# Patient Record
Sex: Female | Born: 1990 | Race: White | Hispanic: No | Marital: Married | Smoking: Never smoker
Health system: Southern US, Community
[De-identification: ages and names within clinical notes are randomized; demographics above are authoritative.]

## PROBLEM LIST (undated history)

## (undated) HISTORY — DX: Hemochromatosis, unspecified: E83.119

## (undated) HISTORY — PX: OTHER SURGICAL HISTORY: SHX169

---

## 2020-12-04 ENCOUNTER — Ambulatory Visit (HOSPITAL_BASED_OUTPATIENT_CLINIC_OR_DEPARTMENT_OTHER)
Admission: RE | Admit: 2020-12-04 | Discharge: 2020-12-04 | Disposition: A | Discharge status: Home / Self Care | Payer: BLUE CROSS/BLUE SHIELD | Source: Ambulatory Visit | Attending: Emergency Medicine | Admitting: Emergency Medicine

## 2020-12-04 ENCOUNTER — Other Ambulatory Visit: Payer: Self-pay | Admitting: Emergency Medicine

## 2020-12-04 ENCOUNTER — Other Ambulatory Visit: Payer: Self-pay

## 2020-12-04 ENCOUNTER — Ambulatory Visit
Admission: EM | Admit: 2020-12-04 | Discharge: 2020-12-04 | Disposition: A | Discharge status: Home / Self Care | Payer: BLUE CROSS/BLUE SHIELD | Attending: Emergency Medicine | Admitting: Emergency Medicine

## 2020-12-04 DIAGNOSIS — M25552 Pain in left hip: Secondary | ICD-10-CM

## 2020-12-04 DIAGNOSIS — R000 Tachycardia, unspecified: Secondary | ICD-10-CM | POA: Diagnosis not present

## 2020-12-04 DIAGNOSIS — J4540 Moderate persistent asthma, uncomplicated: Secondary | ICD-10-CM

## 2020-12-04 LAB — POCT INFLUENZA A/B
Influenza A, POC: NEGATIVE
Influenza B, POC: NEGATIVE

## 2020-12-04 MED ORDER — METHYLPREDNISOLONE SODIUM SUCC 125 MG IJ SOLR
125.0000 mg | Freq: Once | INTRAMUSCULAR | Status: AC
  Administered 2020-12-04: 125 mg via INTRAMUSCULAR

## 2020-12-04 MED ORDER — METHYLPREDNISOLONE 4 MG PO TBPK: ORAL_TABLET | ORAL | 0 refills | Status: DC

## 2020-12-04 NOTE — Discharge Instructions (Addendum)
For the pain in your left hip, you received an injection of Solu-Medrol 125 mg in the office today.  I would like for you to follow this up with a tapering dose of methylprednisolone, please take 1 row of tablets daily for the next 6 days.  I recommend that you take 3 days off from work to allow the inflammation in your left hip joint to calm down.  Please go to the [facility] emergency room location for x-ray of her left hip.  We will be looking for signs of arthritis, bone spurs, joint displacement, possible soft tissue displacement.  Based on those results, you may need to see orthopedic, I provided you with the names of 2 clinics that accept patients on walk-in basis without appointments, please call before you go.  I have initiated a request on your behalf to help you find a primary care provider for follow-up of your asthma, hemochromatosis and hip pain.  Also performed a CBC in clinic to evaluate your number of red blood cells and you number of white blood cells as well as the breakdown of what types of white blood cells are elevated if elevated at all.  This would be good information for your new primary care provider.  With hemochromatosis, we know that some patients develop early arthritis and inflammation of the soft tissue and joints due to the elevated iron deposits.  If orthopedics does not feel that they can help you with this issue, you may also be referred to see a rheumatologist.  There are good immune suppressing drugs that can help with arthritic changes related to hemochromatosis.  Finally, you will be contacted with the results of your influenza test as soon as it is complete.  Thank you for visiting urgent care, I have refilled her same.

## 2020-12-04 NOTE — ED Triage Notes (Signed)
Pt report left hip pain, patient denies falling or doing anything that may have caused pain.  Started: 2 days ago

## 2020-12-04 NOTE — ED Provider Notes (Signed)
[facility]    CSN: [id number] Arrival date & time: 12/04/20  0919      History   Chief Complaint Chief Complaint  Patient presents with   Leg Pain    HPI Lori Benton is a 30 y.o. female.   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  Patient states that when she got home from work last night she was unable to bear weight on her left leg without pain, states the pain is in her hip and radiates from front to back and also down her leg.  Patient states that she is able to lie comfortably on her right side however she lays on her left side or her back she is unable to sleep secondary to intolerable pain.  Patient states she has not had this in the past.  Patient states she is not tried any medication for this.  Patient reports acute onset, denies trauma to hip, recent fall, reports a family history of arthritis in mom.  Per my observation, patient is petite in size, normal weight.  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  The history is provided by the patient.   History reviewed. No pertinent past medical history.  There are no problems to display for this patient.   History reviewed. No pertinent surgical history.  OB History   No obstetric history on file.      Home Medications    Prior to Admission medications   Medication Sig Start Date End Date Taking? Authorizing Provider  methylPREDNISolone (MEDROL DOSEPAK) 4 MG TBPK tablet Take 24 mg on day 1, 20 mg on day 2, 16 mg on day 3, 12 mg on day 4, 8 mg on day 5, 4 mg on day 6. 12/04/20  Yes Theadora Rama Scales, PA-C    Family History No family history on file.  Social History Social History   Tobacco Use   Smoking status: Never   Smokeless tobacco: Never  Vaping Use   Vaping Use: Never used  Substance Use Topics   Alcohol use: Never   Drug use: Never      Allergies   Sulfa antibiotics   Review of Systems Review of Systems Pertinent findings noted in history of present illness.    Physical Exam Triage Vital Signs ED Triage Vitals  Enc Vitals Group     BP 11/29/20 0827 (!) 147/82     Pulse Rate 11/29/20 0827 72     Resp 11/29/20 0827 18     Temp 11/29/20 0827 98.3 F (36.8 C)     Temp Source 11/29/20 0827 Oral     SpO2 11/29/20 0827 98 %     Weight --      Height --      Head Circumference --      Peak Flow --      Pain Score 11/29/20 0826 5     Pain Loc --      Pain Edu? --      Excl. in GC? --    No data found.  Updated Vital Signs BP 116/79 (BP Location: Left Arm)   Pulse (!) 105   Temp 99.5 F (37.5 C) (Oral)   Resp 18   LMP 12/02/2020   SpO2 98%   Visual Acuity Right Eye Distance:   Left Eye Distance:   Bilateral Distance:  Right Eye Near:   Left Eye Near:    Bilateral Near:     Physical Exam Vitals and nursing note reviewed.  Constitutional:      General: She is not in acute distress.    Appearance: Normal appearance. She is not ill-appearing.  HENT:     Head: Normocephalic and atraumatic.  Eyes:     General: Lids are normal.        Right eye: No discharge.        Left eye: No discharge.     Extraocular Movements: Extraocular movements intact.     Conjunctiva/sclera: Conjunctivae normal.     Right eye: Right conjunctiva is not injected.     Left eye: Left conjunctiva is not injected.  Neck:     Trachea: Trachea and phonation normal.  Cardiovascular:     Rate and Rhythm: Normal rate and regular rhythm.     Pulses: Normal pulses.     Heart sounds: Normal heart sounds. No murmur heard.   No friction rub. No gallop.  Pulmonary:     Effort: Pulmonary effort is normal. No accessory muscle usage, prolonged expiration or respiratory distress.     Breath sounds: Normal breath sounds. No stridor, decreased air movement or transmitted upper airway sounds. No decreased breath sounds, wheezing,  rhonchi or rales.  Chest:     Chest wall: No tenderness.  Musculoskeletal:        General: Normal range of motion.     Cervical back: Normal range of motion and neck supple. Normal range of motion.     Comments: Significant tenderness appreciated with deep palpation at left iliac crest, left SI joint, left greater trochanter, and the inguinal ligament  Lymphadenopathy:     Cervical: No cervical adenopathy.  Skin:    General: Skin is warm and dry.     Findings: No erythema or rash.  Neurological:     General: No focal deficit present.     Mental Status: She is alert and oriented to person, place, and time.  Psychiatric:        Mood and Affect: Mood normal.        Behavior: Behavior normal.     UC Treatments / Results  Labs (all labs ordered are listed, but only abnormal results are displayed) Labs Reviewed  CBC WITH DIFFERENTIAL/PLATELET  POCT INFLUENZA A/B    EKG   Radiology DG HIP UNILAT W OR W/O PELVIS 2-3 VIEWS LEFT  Result Date: 12/04/2020 CLINICAL DATA:  69 female with history of acute onset left hip pain. EXAM: DG HIP (WITH OR WITHOUT PELVIS) 2-3V LEFT COMPARISON:  None. FINDINGS: There is no evidence of hip fracture or dislocation. There is no evidence of arthropathy or other focal bone abnormality. IMPRESSION: No acute fracture, malalignment, or significant degenerative change. Electronically Signed   By: Marliss Coots M.D.   On: 12/04/2020 12:48    Procedures Procedures (including critical care time)  Medications Ordered in UC Medications  methylPREDNISolone sodium succinate (SOLU-MEDROL) 125 mg/2 mL injection 125 mg (125 mg Intramuscular Given 12/04/20 1134)    Initial Impression / Assessment and Plan / UC Course  I have reviewed the triage vital signs and the nursing notes.  Pertinent labs & imaging results that were available during my care of the patient were reviewed by me and considered in my medical decision making (see chart for details).      Patient advised that her symptoms of pain in her left hip may be related  to her history of hemochromatosis however could also be a repetitive use injury.  I have recommended that she have an x-ray performed of her hip and have also provided her with a 6-day course of steroids and a tapering dose to help relieve for her more immediate discomfort.  We also provided her with the names of 2 orthopedic clinics where she can obtain an appointment should her x-ray be worrisome for any arthritis or joint dysfunction.  I also advised patient that she may consider seeing a rheumatologist if orthopedics does not feel that they could be in of any use.  I have also initiated a request to help patient find a primary care physician to follow her hemochromatosis and asthma, possibly provide referrals for the above if needed.  Patient verbalized understanding and agreement of plan as discussed.  All questions were addressed during visit.  Please see discharge instructions below for further details of plan.  Final Clinical Impressions(s) / UC Diagnoses   Final diagnoses:  Left hip pain  Tachycardia  Hemochromatosis, unspecified hemochromatosis type  Moderate persistent asthma without complication     Discharge Instructions      For the pain in your left hip, you received an injection of Solu-Medrol 125 mg in the office today.  I would like for you to follow this up with a tapering dose of methylprednisolone, please take 1 row of tablets daily for the next 6 days.  I recommend that you take 3 days off from work to allow the inflammation in your left hip joint to calm down.  Please go to the [facility] emergency room location for x-ray of her left hip.  We will be looking for signs of arthritis, bone spurs, joint displacement, possible soft tissue displacement.  Based on those results, you may need to see orthopedic, I provided you with the names of 2 clinics that accept patients on walk-in basis without  appointments, please call before you go.  I have initiated a request on your behalf to help you find a primary care provider for follow-up of your asthma, hemochromatosis and hip pain.  Also performed a CBC in clinic to evaluate your number of red blood cells and you number of white blood cells as well as the breakdown of what types of white blood cells are elevated if elevated at all.  This would be good information for your new primary care provider.  With hemochromatosis, we know that some patients develop early arthritis and inflammation of the soft tissue and joints due to the elevated iron deposits.  If orthopedics does not feel that they can help you with this issue, you may also be referred to see a rheumatologist.  There are good immune suppressing drugs that can help with arthritic changes related to hemochromatosis.  Finally, you will be contacted with the results of your influenza test as soon as it is complete.  Thank you for visiting urgent care, I have refilled her same.     ED Prescriptions     Medication Sig Dispense Auth. Provider   methylPREDNISolone (MEDROL DOSEPAK) 4 MG TBPK tablet Take 24 mg on day 1, 20 mg on day 2, 16 mg on day 3, 12 mg on day 4, 8 mg on day 5, 4 mg on day 6. 21 tablet Theadora Rama Scales, PA-C      PDMP not reviewed this encounter.    Theadora Rama Scales, PA-C 12/04/20 1729

## 2020-12-05 LAB — CBC WITH DIFFERENTIAL/PLATELET
Basophils Absolute: 0.1 10*3/uL (ref 0.0–0.2)
Basos: 0 %
EOS (ABSOLUTE): 0 10*3/uL (ref 0.0–0.4)
Eos: 0 %
Hematocrit: 42.6 % (ref 34.0–46.6)
Hemoglobin: 14.2 g/dL (ref 11.1–15.9)
Immature Grans (Abs): 0 10*3/uL (ref 0.0–0.1)
Immature Granulocytes: 0 %
Lymphocytes Absolute: 1.4 10*3/uL (ref 0.7–3.1)
Lymphs: 9 %
MCH: 32.1 pg (ref 26.6–33.0)
MCHC: 33.3 g/dL (ref 31.5–35.7)
MCV: 96 fL (ref 79–97)
Monocytes Absolute: 1.2 10*3/uL — ABNORMAL HIGH (ref 0.1–0.9)
Monocytes: 8 %
Neutrophils Absolute: 12.5 10*3/uL — ABNORMAL HIGH (ref 1.4–7.0)
Neutrophils: 83 %
Platelets: 275 10*3/uL (ref 150–450)
RBC: 4.43 x10E6/uL (ref 3.77–5.28)
RDW: 12 % (ref 11.7–15.4)
WBC: 15.1 10*3/uL — ABNORMAL HIGH (ref 3.4–10.8)

## 2021-01-23 ENCOUNTER — Ambulatory Visit: Payer: Self-pay | Admitting: Nurse Practitioner

## 2021-01-24 ENCOUNTER — Encounter: Payer: Self-pay | Admitting: Medical

## 2021-01-24 ENCOUNTER — Ambulatory Visit (INDEPENDENT_AMBULATORY_CARE_PROVIDER_SITE_OTHER): Payer: BLUE CROSS/BLUE SHIELD | Admitting: Medical

## 2021-01-24 VITALS — BP 128/70 | HR 76 | Resp 18 | Ht 63.0 in | Wt 123.0 lb

## 2021-01-24 DIAGNOSIS — Z23 Encounter for immunization: Secondary | ICD-10-CM | POA: Diagnosis not present

## 2021-01-24 DIAGNOSIS — Z124 Encounter for screening for malignant neoplasm of cervix: Secondary | ICD-10-CM

## 2021-01-24 DIAGNOSIS — Z0000 Encounter for general adult medical examination without abnormal findings: Secondary | ICD-10-CM | POA: Diagnosis not present

## 2021-01-24 DIAGNOSIS — J4520 Mild intermittent asthma, uncomplicated: Secondary | ICD-10-CM

## 2021-01-24 LAB — CBC WITH DIFFERENTIAL/PLATELET
Basophils Absolute: 0 10*3/uL (ref 0.0–0.1)
Basophils Relative: 0.6 % (ref 0.0–3.0)
Eosinophils Absolute: 0.4 10*3/uL (ref 0.0–0.7)
Eosinophils Relative: 5.2 % — ABNORMAL HIGH (ref 0.0–5.0)
HCT: 40.3 % (ref 36.0–46.0)
Hemoglobin: 13.4 g/dL (ref 12.0–15.0)
Lymphocytes Relative: 37.6 % (ref 12.0–46.0)
Lymphs Abs: 2.8 10*3/uL (ref 0.7–4.0)
MCHC: 33.3 g/dL (ref 30.0–36.0)
MCV: 97.6 fl (ref 78.0–100.0)
Monocytes Absolute: 0.5 10*3/uL (ref 0.1–1.0)
Monocytes Relative: 6.1 % (ref 3.0–12.0)
Neutro Abs: 3.7 10*3/uL (ref 1.4–7.7)
Neutrophils Relative %: 50.5 % (ref 43.0–77.0)
Platelets: 276 10*3/uL (ref 150.0–400.0)
RBC: 4.13 Mil/uL (ref 3.87–5.11)
RDW: 13.3 % (ref 11.5–15.5)
WBC: 7.3 10*3/uL (ref 4.0–10.5)

## 2021-01-24 LAB — COMPREHENSIVE METABOLIC PANEL
ALT: 24 U/L (ref 0–35)
AST: 19 U/L (ref 0–37)
Albumin: 4.1 g/dL (ref 3.5–5.2)
Alkaline Phosphatase: 73 U/L (ref 39–117)
BUN: 14 mg/dL (ref 6–23)
CO2: 28 mEq/L (ref 19–32)
Calcium: 8.7 mg/dL (ref 8.4–10.5)
Chloride: 102 mEq/L (ref 96–112)
Creatinine, Ser: 0.95 mg/dL (ref 0.40–1.20)
GFR: 80.53 mL/min (ref >60.00)
Glucose, Bld: 79 mg/dL (ref 70–99)
Potassium: 3.7 mEq/L (ref 3.5–5.1)
Sodium: 138 mEq/L (ref 135–145)
Total Bilirubin: 0.4 mg/dL (ref 0.2–1.2)
Total Protein: 6.9 g/dL (ref 6.0–8.3)

## 2021-01-24 LAB — IRON: Iron: 108 ug/dL (ref 42–145)

## 2021-01-24 LAB — LIPID PANEL
Cholesterol: 221 mg/dL — ABNORMAL HIGH (ref 0–200)
HDL: 66.1 mg/dL (ref >39.00)
LDL Cholesterol: 141 mg/dL — ABNORMAL HIGH (ref 0–99)
NonHDL: 154.97
Total CHOL/HDL Ratio: 3
Triglycerides: 68 mg/dL (ref 0.0–149.0)
VLDL: 13.6 mg/dL (ref 0.0–40.0)

## 2021-01-24 MED ORDER — ALBUTEROL SULFATE HFA 108 (90 BASE) MCG/ACT IN AERS: 2.0000 | INHALATION_SPRAY | Freq: Four times a day (QID) | RESPIRATORY_TRACT | 0 refills | Status: DC | PRN

## 2021-01-24 NOTE — Progress Notes (Signed)
Subjective:    Patient ID: Lori Benton, female    DOB: October 22, 1990, 30 y.o.   MRN: [id number]  HPI  Pt in for first time.  Pt works post [employer], Pt does not exercise officially but walks 15,000 steps at work. Pt thinks eats moderate healthy. Drinks 2-3 sodas at night. Non smoker. Occasional alcohol use 1-2 times a months. When dose drink 7-8 shots. Has 3 children.  Pt states new to area for 1.5 years.   Pt moved from [place]. Orignally from [place].  States in November. some bursitis of her rt hip. Pt was given steroid injection and medrol dose taper pack.   Hx hemochromatosis. Pt states dx in 2010. She saw specialist briefly.   Last papsmear 2016. Normal.   Lmp- Jan 05, 2021.      Review of Systems  Constitutional:  Negative for chills, fatigue and fever.  HENT:  Negative for congestion, drooling and ear discharge.   Respiratory:  Negative for cough, chest tightness, shortness of breath and wheezing.   Cardiovascular:  Negative for chest pain and palpitations.  Gastrointestinal:  Negative for abdominal distention, abdominal pain, blood in stool, constipation, nausea and vomiting.  Genitourinary:  Negative for difficulty urinating, dysuria, flank pain, frequency and urgency.  Musculoskeletal:  Negative for back pain, myalgias and neck stiffness.  Skin:  Negative for rash.  Neurological:  Negative for dizziness, speech difficulty, weakness, numbness and headaches.  Hematological:  Negative for adenopathy. Does not bruise/bleed easily.  Psychiatric/Behavioral:  Negative for behavioral problems and decreased concentration.    No past medical history on file.   Social History   Socioeconomic History   Marital status: Married    Spouse name: Not on file   Number of children: Not on file   Years of education: Not on file   Highest education level: Not on file  Occupational History   Not on file  Tobacco Use   Smoking status: Never   Smokeless tobacco: Never   Vaping Use   Vaping Use: Never used  Substance and Sexual Activity   Alcohol use: Never   Drug use: Never   Sexual activity: Not on file  Other Topics Concern   Not on file  Social History Narrative   Not on file   Social Determinants of Health   Financial Resource Strain: Not on file  Food Insecurity: Not on file  Transportation Needs: Not on file  Physical Activity: Not on file  Stress: Not on file  Social Connections: Not on file  Intimate Partner Violence: Not on file    No past surgical history on file.  No family history on file.  Allergies  Allergen Reactions   Sulfa Antibiotics Anaphylaxis   Medrol [Methylprednisolone] Rash    No current outpatient medications on file prior to visit.   No current facility-administered medications on file prior to visit.    BP 128/70    Pulse 76    Resp 18    Ht 5\' 3"  (1.6 m)    Wt 123 lb (55.8 kg)    LMP 01/05/2021    SpO2 100%    BMI 21.79 kg/m        Objective:   Physical Exam  General Mental Status- Alert. General Appearance- Not in acute distress.   Skin General: Color- Normal Color. Moisture- Normal Moisture.  Neck Carotid Arteries- Normal color. Moisture- Normal Moisture. No carotid bruits. No JVD.  Chest and Lung Exam Auscultation: Breath Sounds:-Normal.  Cardiovascular Auscultation:Rythm- Regular. Murmurs &  Other Heart Sounds:Auscultation of the heart reveals- No Murmurs.  Abdomen Inspection:-Inspeection Normal. Palpation/Percussion:Note:No mass. Palpation and Percussion of the abdomen reveal- Non Tender, Non Distended + BS, no rebound or guarding.   Neurologic Cranial Nerve exam:- CN III-XII intact(No nystagmus), symmetric smile. Strength:- 5/5 equal and symmetric strength both upper and lower extremities.       Assessment & Plan:   Patient Instructions  For you wellness exam today I have ordered cbc, cmp and lipid panel.  Flu vaccine today  Recommend exercise and healthy diet.  We  will let you know lab results as they come in.  Follow up date appointment will be determined after lab review.    Refer to gyn for screening papsmear.  Add iron level and refer to hematologist for hemochromatosis.  Rare wheezing with hx of asthma. Last flare in 2018. Rx albuterol inhaler.    Esperanza Richters, PA-C

## 2021-01-24 NOTE — Patient Instructions (Addendum)
For you wellness exam today I have ordered cbc, cmp and lipid panel. ° °Flu vaccine today ° °Recommend exercise and healthy diet. ° °We will let you know lab results as they come in. ° °Follow up date appointment will be determined after lab review.   ° °Refer to gyn for screening papsmear. ° °Add iron level and refer to hematologist for hemochromatosis. ° °Rare wheezing with hx of asthma. Last flare in 2018. Rx albuterol inhaler. ° °Preventive Care 21-39 Years Old, Female °Preventive care refers to lifestyle choices and visits with your health care provider that can promote health and wellness. Preventive care visits are also called wellness exams. °What can I expect for my preventive care visit? °Counseling °During your preventive care visit, your health care provider may ask about your: °Medical history, including: °Past medical problems. °Family medical history. °Pregnancy history. °Current health, including: °Menstrual cycle. °Method of birth control. °Emotional well-being. °Home life and relationship well-being. °Sexual activity and sexual health. °Lifestyle, including: °Alcohol, nicotine or tobacco, and drug use. °Access to firearms. °Diet, exercise, and sleep habits. °Work and work environment. °Sunscreen use. °Safety issues such as seatbelt and bike helmet use. °Physical exam °Your health care provider may check your: °Height and weight. These may be used to calculate your BMI (body mass index). BMI is a measurement that tells if you are at a healthy weight. °Waist circumference. This measures the distance around your waistline. This measurement also tells if you are at a healthy weight and may help predict your risk of certain diseases, such as type 2 diabetes and high blood pressure. °Heart rate and blood pressure. °Body temperature. °Skin for abnormal spots. °What immunizations do I need? °Vaccines are usually given at various ages, according to a schedule. Your health care provider will recommend  vaccines for you based on your age, medical history, and lifestyle or other factors, such as travel or where you work. °What tests do I need? °Screening °Your health care provider may recommend screening tests for certain conditions. This may include: °Pelvic exam and Pap test. °Lipid and cholesterol levels. °Diabetes screening. This is done by checking your blood sugar (glucose) after you have not eaten for a while (fasting). °Hepatitis B test. °Hepatitis C test. °HIV (human immunodeficiency virus) test. °STI (sexually transmitted infection) testing, if you are at risk. °BRCA-related cancer screening. This may be done if you have a family history of breast, ovarian, tubal, or peritoneal cancers. °Talk with your health care provider about your test results, treatment options, and if necessary, the need for more tests. °Follow these instructions at home: °Eating and drinking ° °Eat a healthy diet that includes fresh fruits and vegetables, whole grains, lean protein, and low-fat dairy products. °Take vitamin and mineral supplements as recommended by your health care provider. °Do not drink alcohol if: °Your health care provider tells you not to drink. °You are pregnant, may be pregnant, or are planning to become pregnant. °If you drink alcohol: °Limit how much you have to 0-1 drink a day. °Know how much alcohol is in your drink. In the [place] one drink equals one 12 oz bottle of beer (355 mL), one 5 oz glass of wine (148 mL), or one 1½ oz glass of hard liquor (44 mL). °Lifestyle °Brush your teeth every morning and night with fluoride toothpaste. Floss one time each day. °Exercise for at least 30 minutes 5 or more days each week. °Do not use any products that contain nicotine or tobacco. These products include cigarettes,   chewing tobacco, and vaping devices, such as e-cigarettes. If you need help quitting, ask your health care provider. °Do not use drugs. °If you are sexually active, practice safe sex. Use a condom or  other form of protection to prevent STIs. °If you do not wish to become pregnant, use a form of birth control. If you plan to become pregnant, see your health care provider for a prepregnancy visit. °Find healthy ways to manage stress, such as: °Meditation, yoga, or listening to music. °Journaling. °Talking to a trusted person. °Spending time with friends and family. °Minimize exposure to UV radiation to reduce your risk of skin cancer. °Safety °Always wear your seat belt while driving or riding in a vehicle. °Do not drive: °If you have been drinking alcohol. Do not ride with someone who has been drinking. °If you have been using any mind-altering substances or drugs. °While texting. °When you are tired or distracted. °Wear a helmet and other protective equipment during sports activities. °If you have firearms in your house, make sure you follow all gun safety procedures. °Seek help if you have been physically or sexually abused. °What's next? °Go to your health care provider once a year for an annual wellness visit. °Ask your health care provider how often you should have your eyes and teeth checked. °Stay up to date on all vaccines. °This information is not intended to replace advice given to you by your health care provider. Make sure you discuss any questions you have with your health care provider. °Document Revised: 07/17/2020 Document Reviewed: 07/17/2020 °Elsevier Patient Education © 2022 [employer] ° ° °

## 2021-01-29 NOTE — Addendum Note (Signed)
Addended by: Gwenevere Abbot on: 01/29/2021 07:19 PM   Modules accepted: Level of Service

## 2021-01-31 ENCOUNTER — Other Ambulatory Visit: Payer: Self-pay | Admitting: Family

## 2021-02-04 ENCOUNTER — Inpatient Hospital Stay: Payer: BLUE CROSS/BLUE SHIELD | Attending: Hematology & Oncology

## 2021-02-04 ENCOUNTER — Inpatient Hospital Stay (HOSPITAL_BASED_OUTPATIENT_CLINIC_OR_DEPARTMENT_OTHER): Payer: BLUE CROSS/BLUE SHIELD | Admitting: Family

## 2021-02-04 ENCOUNTER — Encounter: Payer: Self-pay | Admitting: Family

## 2021-02-04 ENCOUNTER — Other Ambulatory Visit: Payer: Self-pay

## 2021-02-04 LAB — RETICULOCYTES
Immature Retic Fract: 7 % (ref 2.3–15.9)
RBC.: 4.03 MIL/uL (ref 3.87–5.11)
Retic Count, Absolute: 74.6 10*3/uL (ref 19.0–186.0)
Retic Ct Pct: 1.9 % (ref 0.4–3.1)

## 2021-02-04 LAB — CMP (CANCER CENTER ONLY)
ALT: 22 U/L (ref 0–44)
AST: 16 U/L (ref 15–41)
Albumin: 4.1 g/dL (ref 3.5–5.0)
Alkaline Phosphatase: 70 U/L (ref 38–126)
Anion gap: 6 (ref 5–15)
BUN: 10 mg/dL (ref 6–20)
CO2: 27 mmol/L (ref 22–32)
Calcium: 9.1 mg/dL (ref 8.9–10.3)
Chloride: 107 mmol/L (ref 98–111)
Creatinine: 0.96 mg/dL (ref 0.44–1.00)
GFR, Estimated: >60 mL/min (ref >60)
Glucose, Bld: 90 mg/dL (ref 70–99)
Potassium: 3.9 mmol/L (ref 3.5–5.1)
Sodium: 140 mmol/L (ref 135–145)
Total Bilirubin: 0.4 mg/dL (ref 0.3–1.2)
Total Protein: 6.8 g/dL (ref 6.5–8.1)

## 2021-02-04 LAB — CBC WITH DIFFERENTIAL (CANCER CENTER ONLY)
Abs Immature Granulocytes: 0.03 10*3/uL (ref 0.00–0.07)
Basophils Absolute: 0 10*3/uL (ref 0.0–0.1)
Basophils Relative: 1 %
Eosinophils Absolute: 0.4 10*3/uL (ref 0.0–0.5)
Eosinophils Relative: 8 %
HCT: 39.2 % (ref 36.0–46.0)
Hemoglobin: 13.1 g/dL (ref 12.0–15.0)
Immature Granulocytes: 1 %
Lymphocytes Relative: 40 %
Lymphs Abs: 2 10*3/uL (ref 0.7–4.0)
MCH: 32.9 pg (ref 26.0–34.0)
MCHC: 33.4 g/dL (ref 30.0–36.0)
MCV: 98.5 fL (ref 80.0–100.0)
Monocytes Absolute: 0.4 10*3/uL (ref 0.1–1.0)
Monocytes Relative: 7 %
Neutro Abs: 2.2 10*3/uL (ref 1.7–7.7)
Neutrophils Relative %: 43 %
Platelet Count: 274 10*3/uL (ref 150–400)
RBC: 3.98 MIL/uL (ref 3.87–5.11)
RDW: 12.4 % (ref 11.5–15.5)
WBC Count: 5 10*3/uL (ref 4.0–10.5)
nRBC: 0 % (ref 0.0–0.2)

## 2021-02-04 LAB — IRON AND IRON BINDING CAPACITY (CC-WL,HP ONLY)
Iron: 95 ug/dL (ref 28–170)
Saturation Ratios: 29 % (ref 10.4–31.8)
TIBC: 333 ug/dL (ref 250–450)
UIBC: 238 ug/dL (ref 148–442)

## 2021-02-04 LAB — LACTATE DEHYDROGENASE: LDH: 148 U/L (ref 98–192)

## 2021-02-04 LAB — FERRITIN: Ferritin: 25 ng/mL (ref 11–307)

## 2021-02-04 NOTE — Progress Notes (Addendum)
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 She states that she has not had insurance for 12 years and did not follow-up.  She had been donating blood as needed with the [employer].  She notes her main symptom to be joint aches and pains.  She has significant paternal history of NASH. No known familial history of hemochromatosis diagnosis.  Total iron in December with PCP was 108.  She states that her cycle is regular and flow is normal. No other blood loss noted.  No bruising or petechiae.  She has 3 children all born by C-section. She had gestational HTN/questionable preeclampsia.   With her last pregnancy she took 2 baby aspirin daily.  No history of miscarriage or still birth.  No history of diabetes or thyroid disease.  She has some occasional SOB secondary to asthma exacerbation.  No fever, chills, n/v, cough, rash, dizziness, chest pain, palpitations, abdominal pain or changes in bowel or bladder habits.  She has some abdominal cramping with her cycle.  No swelling or tenderness in her extremities at this time.  She has occasional tingling in her arms/hands felt to possibly be positions.  She does not smoke or use recreational drugs. She has the occasional alcoholic beverage socially.  She works for [employer] and enjoys her job.   ROS: All other 10 point review of systems is negative.   PAST MEDICAL HISTORY:   Past Medical History:  Diagnosis Date   Hemochromatosis     ALLERGIES: Allergies  Allergen Reactions   Sulfa Antibiotics Anaphylaxis   Medrol  [Methylprednisolone] Rash      MEDICATIONS:  Current Outpatient Medications on File Prior to Visit  Medication Sig Dispense Refill   albuterol (VENTOLIN HFA) 108 (90 Base) MCG/ACT inhaler Inhale 2 puffs into the lungs every 6 (six) hours as needed. 18 g 0   No current facility-administered medications on file prior to visit.     PAST SURGICAL HISTORY Past Surgical History:  Procedure Laterality Date   c section      FAMILY HISTORY: No family history on file.  SOCIAL HISTORY:  reports that she has never smoked. She has never used smokeless tobacco. She reports current alcohol use of about 7.0 standard drinks per week. She reports that she does not use drugs.  PERFORMANCE STATUS: The patient's performance status is 0 - Asymptomatic  PHYSICAL EXAM: Most Recent Vital Signs: Last menstrual period 01/05/2021. LMP 01/05/2021   General Appearance:    Alert, cooperative, no distress, appears stated age  Head:    Normocephalic, without obvious abnormality, atraumatic  Eyes:    PERRL, conjunctiva/corneas clear, EOM's intact, fundi    benign, both eyes        Throat:   Lips, mucosa, and tongue normal; teeth and gums normal  Neck:   Supple, symmetrical, trachea midline, no adenopathy;    thyroid:  no enlargement/tenderness/nodules; no carotid   bruit or JVD  Back:     Symmetric, no curvature, ROM normal, no CVA tenderness  Lungs:     Clear to auscultation bilaterally, respirations unlabored  Chest Wall:    No tenderness or deformity   Heart:    Regular rate and rhythm, S1 and S2 normal, no murmur, rub   or gallop     Abdomen:     Soft, non-tender, bowel sounds active all four quadrants,    no masses, no organomegaly        Extremities:   Extremities normal, atraumatic, no cyanosis or edema  Pulses:   2+ and symmetric all extremities  Skin:   Skin color, texture, turgor normal, no rashes or lesions  Lymph nodes:   Cervical, supraclavicular, and axillary nodes normal   Neurologic:   CNII-XII intact, normal strength, sensation and reflexes    throughout    LABORATORY DATA:  Results for orders placed or performed in visit on 02/04/21 (from the past 48 hour(s))  Reticulocytes     Status: None   Collection Time: 02/04/21  8:12 AM  Result Value Ref Range   Retic Ct Pct 1.9 0.4 - 3.1 %   RBC. 4.03 3.87 - 5.11 MIL/uL   Retic Count, Absolute 74.6 19.0 - 186.0 K/uL   Immature Retic Fract 7.0 2.3 - 15.9 %    Comment: Performed at [facility], [address] [place], [place] [address]  CBC with Differential (Cancer Center Only)     Status: None   Collection Time: 02/04/21  8:12 AM  Result Value Ref Range   WBC Count 5.0 4.0 - 10.5 K/uL   RBC 3.98 3.87 - 5.11 MIL/uL   Hemoglobin 13.1 12.0 - 15.0 g/dL   HCT 42.5 95.6 - 38.7 %   MCV 98.5 80.0 - 100.0 fL   MCH 32.9 26.0 - 34.0 pg   MCHC 33.4 30.0 - 36.0 g/dL   RDW 56.4 33.2 - 95.1 %   Platelet Count 274 150 - 400 K/uL   nRBC 0.0 0.0 - 0.2 %   Neutrophils Relative % 43 %   Neutro Abs 2.2 1.7 - 7.7 K/uL   Lymphocytes Relative 40 %   Lymphs Abs 2.0 0.7 - 4.0 K/uL   Monocytes Relative 7 %   Monocytes Absolute 0.4 0.1 - 1.0 K/uL   Eosinophils Relative 8 %   Eosinophils Absolute 0.4 0.0 - 0.5 K/uL   Basophils Relative 1 %   Basophils Absolute 0.0 0.0 - 0.1 K/uL   Immature Granulocytes 1 %   Abs Immature Granulocytes 0.03 0.00 - 0.07 K/uL    Comment: Performed at [facility], [address] [place], [place] [address]      RADIOGRAPHY: No results found.     PATHOLOGY: None  ASSESSMENT/PLAN: Lori Benton is a very pleasant 31 yo caucasian female with history of hemochromatosis diagnosed in 2010.  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  Iron studies pending. We will get her onto a donation schedule if  needed. Follow-up in 4 months.   All questions were answered. The patient knows to call the clinic with any problems, questions or concerns. We can certainly see the patient much sooner if necessary.  Eileen Stanford, NP

## 2021-02-05 ENCOUNTER — Encounter: Payer: Self-pay | Admitting: *Deleted

## 2021-02-07 LAB — HEMOCHROMATOSIS DNA-PCR(C282Y,H63D)

## 2021-04-02 ENCOUNTER — Encounter: Payer: BLUE CROSS/BLUE SHIELD | Admitting: Family Medicine

## 2021-05-20 ENCOUNTER — Telehealth: Payer: Self-pay | Admitting: *Deleted

## 2021-05-20 NOTE — Telephone Encounter (Signed)
Called patient and lvm of rescheduling appointment - requested callback to confirm 

## 2021-05-26 ENCOUNTER — Encounter: Payer: Self-pay | Admitting: Family

## 2021-05-26 ENCOUNTER — Inpatient Hospital Stay: Payer: BLUE CROSS/BLUE SHIELD | Attending: Hematology & Oncology

## 2021-05-26 ENCOUNTER — Inpatient Hospital Stay (HOSPITAL_BASED_OUTPATIENT_CLINIC_OR_DEPARTMENT_OTHER): Payer: BLUE CROSS/BLUE SHIELD | Admitting: Family

## 2021-05-26 LAB — CMP (CANCER CENTER ONLY)
ALT: 14 U/L (ref 0–44)
AST: 17 U/L (ref 15–41)
Albumin: 4.3 g/dL (ref 3.5–5.0)
Alkaline Phosphatase: 85 U/L (ref 38–126)
Anion gap: 9 (ref 5–15)
BUN: 10 mg/dL (ref 6–20)
CO2: 26 mmol/L (ref 22–32)
Calcium: 9.5 mg/dL (ref 8.9–10.3)
Chloride: 105 mmol/L (ref 98–111)
Creatinine: 0.87 mg/dL (ref 0.44–1.00)
GFR, Estimated: >60 mL/min (ref >60)
Glucose, Bld: 85 mg/dL (ref 70–99)
Potassium: 3.6 mmol/L (ref 3.5–5.1)
Sodium: 140 mmol/L (ref 135–145)
Total Bilirubin: 0.3 mg/dL (ref 0.3–1.2)
Total Protein: 7.8 g/dL (ref 6.5–8.1)

## 2021-05-26 LAB — CBC WITH DIFFERENTIAL (CANCER CENTER ONLY)
Abs Immature Granulocytes: 0.02 10*3/uL (ref 0.00–0.07)
Basophils Absolute: 0.1 10*3/uL (ref 0.0–0.1)
Basophils Relative: 1 %
Eosinophils Absolute: 0.3 10*3/uL (ref 0.0–0.5)
Eosinophils Relative: 4 %
HCT: 38.2 % (ref 36.0–46.0)
Hemoglobin: 12.5 g/dL (ref 12.0–15.0)
Immature Granulocytes: 0 %
Lymphocytes Relative: 26 %
Lymphs Abs: 2.4 10*3/uL (ref 0.7–4.0)
MCH: 31.4 pg (ref 26.0–34.0)
MCHC: 32.7 g/dL (ref 30.0–36.0)
MCV: 96 fL (ref 80.0–100.0)
Monocytes Absolute: 0.6 10*3/uL (ref 0.1–1.0)
Monocytes Relative: 6 %
Neutro Abs: 5.8 10*3/uL (ref 1.7–7.7)
Neutrophils Relative %: 63 %
Platelet Count: 324 10*3/uL (ref 150–400)
RBC: 3.98 MIL/uL (ref 3.87–5.11)
RDW: 12.8 % (ref 11.5–15.5)
WBC Count: 9.1 10*3/uL (ref 4.0–10.5)
nRBC: 0 % (ref 0.0–0.2)

## 2021-05-26 LAB — IRON AND IRON BINDING CAPACITY (CC-WL,HP ONLY)
Iron: 28 ug/dL (ref 28–170)
Saturation Ratios: 8 % — ABNORMAL LOW (ref 10.4–31.8)
TIBC: 371 ug/dL (ref 250–450)
UIBC: 343 ug/dL (ref 148–442)

## 2021-05-26 LAB — FERRITIN: Ferritin: 16 ng/mL (ref 11–307)

## 2021-05-26 NOTE — Progress Notes (Signed)
?Hematology and Oncology Follow Up Visit ? ?Lori Benton ?[id number] ?[birth year] 30 y.o. ?05/26/2021 ? ? 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 ?Current Therapy:   ?Phlebotomy to maintain iron saturation < 50% and ferritin < 100 ?Donates with [employer] when needed ?  ?Interim History:  Lori Benton is here today for follow-up. She is doing well and has no complaints at this time.  ?No headaches, blurred/ loss of vision, fever, chills, n/v, couhg, rash, dizziness, SOB, chest pain, palpitations, abdominal pain or changes in bowel or bladder habits.  ?No swelling or tenderness in her extremities. She has occasional numbness and tingling in her arms due to a childhood injury.  ?No falls or syncope.  ?She has maintained a good appetite and is staying well hydrated. Her weight is stable at 123 lbs.  ? ?ECOG Performance Status: 1 - Symptomatic but completely ambulatory ? ?Medications:  ?Allergies as of 05/26/2021   ? ?   Reactions  ? Celery (apium Graveolens Var. Dulce) Skin Test Hives, Shortness Of Breath, Itching, Cough  ? Sulfa Antibiotics Anaphylaxis  ? Medrol [methylprednisolone] Rash  ? ?  ? ?  ?Medication List  ?  ? ?  ? Accurate as of May 26, 2021  8:38 AM. If you have any questions, ask your nurse or doctor.  ?  ?  ? ?  ? ?albuterol 108 (90 Base) MCG/ACT inhaler ?Commonly known as: VENTOLIN HFA ?Inhale 2 puffs into the lungs every 6 (six) hours as needed. ?  ? ?  ? ? ?Allergies:  ?Allergies  ?Allergen Reactions  ? Celery (Apium Graveolens Var. Dulce) Skin Test Hives, Shortness Of Breath, Itching and Cough  ? Sulfa Antibiotics Anaphylaxis  ? Medrol [Methylprednisolone] Rash  ? ? ?Past Medical History, Surgical history, Social history, and Family History were reviewed and updated. ? ?Review of Systems: ?All other 10 point review of systems is negative.  ? ?Physical Exam: ? weight is 123 lb 12.8 oz (56.2 kg). Her oral temperature is 98.7 ?F (37.1 ?C). Her blood  pressure is 120/90 and her pulse is 75. Her respiration is 18 and oxygen saturation is 100%.  ? ?Wt Readings from Last 3 Encounters:  ?05/26/21 123 lb 12.8 oz (56.2 kg)  ?02/04/21 121 lb 1.3 oz (54.9 kg)  ?01/24/21 123 lb (55.8 kg)  ? ? ?Ocular: Sclerae unicteric, pupils equal, round and reactive to light ?Ear-nose-throat: Oropharynx clear, dentition fair ?Lymphatic: No cervical or supraclavicular adenopathy ?Lungs no rales or rhonchi, good excursion bilaterally ?Heart regular rate and rhythm, no murmur appreciated ?Abd soft, nontender, positive bowel sounds ?MSK no focal spinal tenderness, no joint edema ?Neuro: non-focal, well-oriented, appropriate affect ?Breasts: Deferred  ? ?Lab Results  ?Component Value Date  ? WBC 9.1 05/26/2021  ? HGB 12.5 05/26/2021  ? HCT 38.2 05/26/2021  ? MCV 96.0 05/26/2021  ? PLT 324 05/26/2021  ? ?Lab Results  ?Component Value Date  ? FERRITIN 25 02/04/2021  ? IRON 95 02/04/2021  ? TIBC 333 02/04/2021  ? UIBC 238 02/04/2021  ? IRONPCTSAT 29 02/04/2021  ? ?Lab Results  ?Component Value Date  ? RETICCTPCT 1.9 02/04/2021  ? RBC 3.98 05/26/2021  ? ?No results found for: KPAFRELGTCHN, LAMBDASER, KAPLAMBRATIO ?No results found for: IGGSERUM, IGA, IGMSERUM ?No results found for: TOTALPROTELP, ALBUMINELP, A1GS, A2GS, BETS, BETA2SER, GAMS, MSPIKE, SPEI ?  Chemistry   ?   ?Component Value Date/Time  ? NA 140 02/04/2021 0812  ? K 3.9 02/04/2021 0812  ?  CL 107 02/04/2021 0812  ? CO2 27 02/04/2021 0812  ? BUN 10 02/04/2021 0812  ? CREATININE 0.96 02/04/2021 0812  ?    ?Component Value Date/Time  ? CALCIUM 9.1 02/04/2021 0812  ? ALKPHOS 70 02/04/2021 0812  ? AST 16 02/04/2021 0812  ? ALT 22 02/04/2021 0812  ? BILITOT 0.4 02/04/2021 2956  ?  ? ? ? ?Impression and Plan: Lori Benton is a very pleasant 31 yo caucasian female with history of hemochromatosis diagnosed in 2010.  ?Iron studies are pending. She [facility].  ?Follow-up in 6 months.  ? ?Eileen Stanford,  NP ?4/24/20238:38 AM ? ?

## 2021-06-04 ENCOUNTER — Ambulatory Visit (INDEPENDENT_AMBULATORY_CARE_PROVIDER_SITE_OTHER): Payer: BLUE CROSS/BLUE SHIELD | Admitting: Family Medicine

## 2021-06-04 ENCOUNTER — Ambulatory Visit: Payer: BLUE CROSS/BLUE SHIELD | Admitting: Family

## 2021-06-04 ENCOUNTER — Other Ambulatory Visit: Payer: BLUE CROSS/BLUE SHIELD

## 2021-06-04 ENCOUNTER — Other Ambulatory Visit (HOSPITAL_COMMUNITY)
Admission: RE | Admit: 2021-06-04 | Discharge: 2021-06-04 | Disposition: A | Discharge status: Home / Self Care | Payer: BLUE CROSS/BLUE SHIELD | Source: Ambulatory Visit | Attending: Family Medicine | Admitting: Family Medicine

## 2021-06-04 ENCOUNTER — Encounter: Payer: Self-pay | Admitting: Family Medicine

## 2021-06-04 VITALS — BP 124/73 | HR 63 | Ht 63.0 in | Wt 125.0 lb

## 2021-06-04 DIAGNOSIS — Z01419 Encounter for gynecological examination (general) (routine) without abnormal findings: Secondary | ICD-10-CM | POA: Insufficient documentation

## 2021-06-04 NOTE — Progress Notes (Signed)
? ? ?GYNECOLOGY ANNUAL PREVENTATIVE CARE ENCOUNTER NOTE ? ?Subjective:  ? Lori Benton is a 31 y.o. G70P3003 female here for a routine annual gynecologic exam.  Current complaints: none. Menses normal - every 27-30 days. About 3-5 days in length.   Denies abnormal vaginal bleeding, discharge, pelvic pain, problems with intercourse or other gynecologic concerns.  ?  ?Gynecologic History ?Patient's last menstrual period was 05/30/2021. ?Patient is sexually active  ?Contraception: tubal ligation ?Last Pap: 2017. Results were: normal ?Last mammogram: N/a ? ? ?The pregnancy intention screening data noted above was reviewed. Potential methods of contraception were discussed. The patient elected to proceed with No data recorded. ? ? ?Obstetric History ?OB History  ?Gravida Para Term Preterm AB Living  ?3 3 3     3   ?SAB IAB Ectopic Multiple Live Births  ?        3  ?  ?# Outcome Date GA Lbr Len/2nd Weight Sex Delivery Anes PTL Lv  ?3 Term 2017 [redacted]w[redacted]d   M CS-LTranv Spinal N LIV  ?2 Term 2015 [redacted]w[redacted]d   F CS-LTranv Spinal N LIV  ?1 Term 2013 [redacted]w[redacted]d   F CS-LTranv EPI N LIV  ? ? ?Past Medical History:  ?Diagnosis Date  ? Hemochromatosis   ? ? ?Past Surgical History:  ?Procedure Laterality Date  ? c section    ? ? ?Current Outpatient Medications on File Prior to Visit  ?Medication Sig Dispense Refill  ? albuterol (VENTOLIN HFA) 108 (90 Base) MCG/ACT inhaler Inhale 2 puffs into the lungs every 6 (six) hours as needed. (Patient not taking: Reported on 05/26/2021) 18 g 0  ? ?No current facility-administered medications on file prior to visit.  ? ? ?Allergies  ?Allergen Reactions  ? Celery (Apium Graveolens Var. Dulce) Skin Test Hives, Shortness Of Breath, Itching and Cough  ? Sulfa Antibiotics Anaphylaxis  ? Medrol [Methylprednisolone] Rash  ? ? ?Social History  ? ?Socioeconomic History  ? Marital status: Married  ?  Spouse name: Not on file  ? Number of children: Not on file  ? Years of education: Not on file  ? Highest education  level: Not on file  ?Occupational History  ? Not on file  ?Tobacco Use  ? Smoking status: Never  ? Smokeless tobacco: Never  ?Vaping Use  ? Vaping Use: Never used  ?Substance and Sexual Activity  ? Alcohol use: Yes  ?  Alcohol/week: 7.0 standard drinks  ?  Types: 7 Shots of liquor per week  ?  Comment: 7-8 shots on average every 2 weeks.  ? Drug use: Never  ? Sexual activity: Yes  ?Other Topics Concern  ? Not on file  ?Social History Narrative  ? Not on file  ? ?Social Determinants of Health  ? ?Financial Resource Strain: Not on file  ?Food Insecurity: Not on file  ?Transportation Needs: Not on file  ?Physical Activity: Not on file  ?Stress: Not on file  ?Social Connections: Not on file  ?Intimate Partner Violence: Not on file  ? ? ?Family History  ?Problem Relation Age of Onset  ? Hypertension Father   ? High Cholesterol Father   ? Anxiety disorder Father   ? Depression Father   ? Depression Mother   ? Anxiety disorder Mother   ? High Cholesterol Mother   ? Hypertension Mother   ? ? ?The following portions of the patient's history were reviewed and updated as appropriate: allergies, current medications, past family history, past medical history, past social history, past surgical  history and problem list. ? ?Review of Systems ?Pertinent items are noted in HPI. ?  ?Objective:  ?BP 124/73   Pulse 63   Ht 5\' 3"  (1.6 m)   Wt 125 lb (56.7 kg)   LMP 05/30/2021   BMI 22.14 kg/m?  ?Wt Readings from Last 3 Encounters:  ?06/04/21 125 lb (56.7 kg)  ?05/26/21 123 lb 12.8 oz (56.2 kg)  ?02/04/21 121 lb 1.3 oz (54.9 kg)  ?  ? ?Chaperone present during exam ? ?CONSTITUTIONAL: Well-developed, well-nourished female in no acute distress.  ?HENT:  Normocephalic, atraumatic, External right and left ear normal. Oropharynx is clear and moist ?EYES: Conjunctivae and EOM are normal. Pupils are equal, round, and reactive to light. No scleral icterus.  ?NECK: Normal range of motion, supple, no masses.  Normal thyroid.   ? ?CARDIOVASCULAR: Normal heart rate noted, regular rhythm ?RESPIRATORY: Clear to auscultation bilaterally. Effort and breath sounds normal, no problems with respiration noted. ?BREASTS: Symmetric in size. No masses, skin changes, nipple drainage, or lymphadenopathy. ?ABDOMEN: Soft, normal bowel sounds, no distention noted.  No tenderness, rebound or guarding.  ?PELVIC: Normal appearing external genitalia; normal appearing vaginal mucosa and cervix.  No abnormal discharge noted.  Normal uterine size, no other palpable masses, no uterine or adnexal tenderness. ?MUSCULOSKELETAL: Normal range of motion. No tenderness.  No cyanosis, clubbing, or edema.  2+ distal pulses. ?SKIN: Skin is warm and dry. No rash noted. Not diaphoretic. No erythema. No pallor. ?NEUROLOGIC: Alert and oriented to person, place, and time. Normal reflexes, muscle tone coordination. No cranial nerve deficit noted. ?PSYCHIATRIC: Normal mood and affect. Normal behavior. Normal judgment and thought content. ? ?Assessment:  ?Annual gynecologic examination with pap smear ?  ?Plan:  ?1. Well Woman Exam ?Will follow up results of pap smear and manage accordingly. ?STD testing discussed. Patient declined testing ?- Cytology - PAP( [facility]) ? ? ?Routine preventative health maintenance measures emphasized. ?Please refer to After Visit Summary for other counseling recommendations.  ? ? ?Loma Boston, DO ?[facility] ?

## 2021-06-05 LAB — CYTOLOGY - PAP
Comment: NEGATIVE
Diagnosis: NEGATIVE
High risk HPV: NEGATIVE

## 2021-08-11 ENCOUNTER — Encounter: Payer: Self-pay | Admitting: Medical

## 2021-11-25 ENCOUNTER — Inpatient Hospital Stay: Payer: BLUE CROSS/BLUE SHIELD

## 2021-11-25 ENCOUNTER — Inpatient Hospital Stay: Payer: BLUE CROSS/BLUE SHIELD | Admitting: Family

## 2021-11-26 ENCOUNTER — Encounter: Payer: Self-pay | Admitting: Family

## 2021-11-26 ENCOUNTER — Inpatient Hospital Stay: Payer: BLUE CROSS/BLUE SHIELD

## 2021-11-26 ENCOUNTER — Inpatient Hospital Stay: Payer: BLUE CROSS/BLUE SHIELD | Attending: Family | Admitting: Family

## 2021-11-26 LAB — CBC WITH DIFFERENTIAL (CANCER CENTER ONLY)
Abs Immature Granulocytes: 0.01 10*3/uL (ref 0.00–0.07)
Basophils Absolute: 0.1 10*3/uL (ref 0.0–0.1)
Basophils Relative: 1 %
Eosinophils Absolute: 0.9 10*3/uL — ABNORMAL HIGH (ref 0.0–0.5)
Eosinophils Relative: 13 %
HCT: 40.8 % (ref 36.0–46.0)
Hemoglobin: 13.3 g/dL (ref 12.0–15.0)
Immature Granulocytes: 0 %
Lymphocytes Relative: 33 %
Lymphs Abs: 2.3 10*3/uL (ref 0.7–4.0)
MCH: 32 pg (ref 26.0–34.0)
MCHC: 32.6 g/dL (ref 30.0–36.0)
MCV: 98.1 fL (ref 80.0–100.0)
Monocytes Absolute: 0.4 10*3/uL (ref 0.1–1.0)
Monocytes Relative: 5 %
Neutro Abs: 3.4 10*3/uL (ref 1.7–7.7)
Neutrophils Relative %: 48 %
Platelet Count: 257 10*3/uL (ref 150–400)
RBC: 4.16 MIL/uL (ref 3.87–5.11)
RDW: 12.9 % (ref 11.5–15.5)
WBC Count: 7 10*3/uL (ref 4.0–10.5)
nRBC: 0 % (ref 0.0–0.2)

## 2021-11-26 LAB — FERRITIN: Ferritin: 11 ng/mL (ref 11–307)

## 2021-11-26 LAB — CMP (CANCER CENTER ONLY)
ALT: 14 U/L (ref 0–44)
AST: 17 U/L (ref 15–41)
Albumin: 4.3 g/dL (ref 3.5–5.0)
Alkaline Phosphatase: 76 U/L (ref 38–126)
Anion gap: 7 (ref 5–15)
BUN: 14 mg/dL (ref 6–20)
CO2: 26 mmol/L (ref 22–32)
Calcium: 9.1 mg/dL (ref 8.9–10.3)
Chloride: 104 mmol/L (ref 98–111)
Creatinine: 1.1 mg/dL — ABNORMAL HIGH (ref 0.44–1.00)
GFR, Estimated: >60 mL/min (ref >60)
Glucose, Bld: 111 mg/dL — ABNORMAL HIGH (ref 70–99)
Potassium: 3.9 mmol/L (ref 3.5–5.1)
Sodium: 137 mmol/L (ref 135–145)
Total Bilirubin: 0.5 mg/dL (ref 0.3–1.2)
Total Protein: 7.3 g/dL (ref 6.5–8.1)

## 2021-11-26 NOTE — Progress Notes (Signed)
Hematology and Oncology Follow Up Visit  Felicita Eastburn OE:[id number] 07/22/1990 31 y.o. 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 Gelin is here today for follow-up. She is doing well and has no complaints at this time.  She is staying busy with work and her kids.  No fever, chills, n/v, cough, rash, dizziness, changes in vision, SOB, chest pain, palpitations, abdominal pain/bloating or changes in bowel or bladder habits at this time.  She has occasional itching after a hot shower.  No hot flashes or night sweats.  No blood loss, bruising or petechiae noted.  No swelling or tenderness in her extremities.  Intermittent numbness and tingling in her arms due to an old injury unchanged.  No falls or syncope reported.  Appetite and is staying well hydrated. Her weight is stable at   ECOG Performance Status: 1 - Symptomatic but completely ambulatory  Medications:  Allergies as of 11/26/2021       Reactions   Celery (apium Graveolens Var. Dulce) Skin Test Hives, Shortness Of Breath, Itching, Cough   Sulfa Antibiotics Anaphylaxis   Medrol [methylprednisolone] Rash        Medication List        Accurate as of November 26, 2021 11:32 AM. If you have any questions, ask your nurse or doctor.          albuterol 108 (90 Base) MCG/ACT inhaler Commonly known as: VENTOLIN HFA Inhale 2 puffs into the lungs every 6 (six) hours as needed.   doxylamine (Sleep) 25 MG tablet Commonly known as: UNISOM Take 25 mg by mouth at bedtime as needed for sleep.        Allergies:  Allergies  Allergen Reactions   Celery (Apium Graveolens Var. Dulce) Skin Test Hives, Shortness Of Breath, Itching and Cough   Sulfa Antibiotics Anaphylaxis   Medrol [Methylprednisolone] Rash    Past  Medical History, Surgical history, Social history, and Family History were reviewed and updated.  Review of Systems: All other 10 point review of systems is negative.   Physical Exam:  weight is 126 lb 1.3 oz (57.2 kg). Her oral temperature is 99.1 F (37.3 C). Her blood pressure is 118/79 and her pulse is 87. Her respiration is 17 and oxygen saturation is 100%.   Wt Readings from Last 3 Encounters:  11/26/21 126 lb 1.3 oz (57.2 kg)  06/04/21 125 lb (56.7 kg)  05/26/21 123 lb 12.8 oz (56.2 kg)    Ocular: Sclerae unicteric, pupils equal, round and reactive to light Ear-nose-throat: Oropharynx clear, dentition fair Lymphatic: No cervical or supraclavicular adenopathy Lungs no rales or rhonchi, good excursion bilaterally Heart regular rate and rhythm, no murmur appreciated Abd soft, nontender, positive bowel sounds MSK no focal spinal tenderness, no joint edema Neuro: non-focal, well-oriented, appropriate affect Breasts: Deferred   Lab Results  Component Value Date   WBC 7.0 11/26/2021   HGB 13.3 11/26/2021   HCT 40.8 11/26/2021   MCV 98.1 11/26/2021   PLT 257 11/26/2021   Lab Results  Component Value Date   FERRITIN 16 05/26/2021   IRON 28 05/26/2021   TIBC 371 05/26/2021   UIBC 343 05/26/2021   IRONPCTSAT 8 (L) 05/26/2021   Lab Results  Component Value Date   RETICCTPCT 1.9 02/04/2021  RBC 4.16 11/26/2021   No results found for: "KPAFRELGTCHN", "LAMBDASER", "KAPLAMBRATIO" No results found for: "IGGSERUM", "IGA", "IGMSERUM" No results found for: "TOTALPROTELP", "ALBUMINELP", "A1GS", "A2GS", "BETS", "BETA2SER", "GAMS", "MSPIKE", "SPEI"   Chemistry      Component Value Date/Time   NA 140 05/26/2021 0803   K 3.6 05/26/2021 0803   CL 105 05/26/2021 0803   CO2 26 05/26/2021 0803   BUN 10 05/26/2021 0803   CREATININE 0.87 05/26/2021 0803      Component Value Date/Time   CALCIUM 9.5 05/26/2021 0803   ALKPHOS 85 05/26/2021 0803   AST 17 05/26/2021 0803   ALT 14  05/26/2021 0803   BILITOT 0.3 05/26/2021 0803       Impression and Plan: Ms. Ouellet is a very pleasant 31 yo caucasian female with history of hemochromatosis diagnosed in 2010.  Iron studies are pending. She will donate with the [facility] if needed.  Follow-up in 6 months.   Lottie Dawson, NP 10/25/202311:32 AM

## 2021-11-27 LAB — IRON AND IRON BINDING CAPACITY (CC-WL,HP ONLY)
Iron: 106 ug/dL (ref 28–170)
Saturation Ratios: 31 % (ref 10.4–31.8)
TIBC: 344 ug/dL (ref 250–450)
UIBC: 238 ug/dL (ref 148–442)

## 2021-12-26 ENCOUNTER — Ambulatory Visit
Admission: EM | Admit: 2021-12-26 | Discharge: 2021-12-26 | Disposition: A | Discharge status: Home / Self Care | Payer: BLUE CROSS/BLUE SHIELD | Attending: Urgent Care | Admitting: Urgent Care

## 2021-12-26 DIAGNOSIS — J988 Other specified respiratory disorders: Secondary | ICD-10-CM

## 2021-12-26 DIAGNOSIS — J309 Allergic rhinitis, unspecified: Secondary | ICD-10-CM | POA: Diagnosis not present

## 2021-12-26 DIAGNOSIS — B9789 Other viral agents as the cause of diseases classified elsewhere: Secondary | ICD-10-CM | POA: Diagnosis not present

## 2021-12-26 DIAGNOSIS — J4520 Mild intermittent asthma, uncomplicated: Secondary | ICD-10-CM

## 2021-12-26 MED ORDER — ALBUTEROL SULFATE HFA 108 (90 BASE) MCG/ACT IN AERS: 2.0000 | INHALATION_SPRAY | Freq: Four times a day (QID) | RESPIRATORY_TRACT | 0 refills | Status: DC | PRN

## 2021-12-26 MED ORDER — PREDNISONE 50 MG PO TABS: 50.0000 mg | ORAL_TABLET | Freq: Every day | ORAL | 0 refills | Status: DC

## 2021-12-26 MED ORDER — PROMETHAZINE-DM 6.25-15 MG/5ML PO SYRP: 5.0000 mL | ORAL_SOLUTION | Freq: Three times a day (TID) | ORAL | 0 refills | Status: DC | PRN

## 2021-12-26 NOTE — ED Provider Notes (Signed)
[facility]  Note:  This document was prepared using Conservation officer, historic buildings and may include unintentional dictation errors.  MRN: [id number] DOB: 05-31-1990  Subjective:   Lori Benton is a 31 y.o. female presenting for 5-6 day history of acute onset sinus congestion, drainage, coughing, chest pain with her coughing.  Has been using Zyrtec-D.  She is supposed to use an inhaler for her asthma but unfortunately was unable to afford it.  No smoking, vaping or marijuana use.  No current facility-administered medications for this encounter.  Current Outpatient Medications:    albuterol (VENTOLIN HFA) 108 (90 Base) MCG/ACT inhaler, Inhale 2 puffs into the lungs every 6 (six) hours as needed. (Patient not taking: Reported on 05/26/2021), Disp: 18 g, Rfl: 0   doxylamine, Sleep, (UNISOM) 25 MG tablet, Take 25 mg by mouth at bedtime as needed for sleep., Disp: , Rfl:    Allergies  Allergen Reactions   Celery (Apium Graveolens Var. Dulce) Skin Test Hives, Shortness Of Breath, Itching and Cough   Sulfa Antibiotics Anaphylaxis   Medrol [Methylprednisolone] Rash    Past Medical History:  Diagnosis Date   Hemochromatosis      Past Surgical History:  Procedure Laterality Date   c section      Family History  Problem Relation Age of Onset   Hypertension Father    High Cholesterol Father    Anxiety disorder Father    Depression Father    Depression Mother    Anxiety disorder Mother    High Cholesterol Mother    Hypertension Mother     Social History   Tobacco Use   Smoking status: Never   Smokeless tobacco: Never  Vaping Use   Vaping Use: Never used  Substance Use Topics   Alcohol use: Yes    Alcohol/week: 7.0 standard drinks of alcohol    Types: 7 Shots of liquor per week    Comment: 7-8 shots on average every 2 weeks.   Drug use: Never    ROS   Objective:   Vitals: BP 120/81   Pulse 81   Temp 97.9 F (36.6 C) (Oral)   LMP  12/18/2021 (Approximate)   SpO2 98%   Physical Exam Constitutional:      General: She is not in acute distress.    Appearance: Normal appearance. She is well-developed and normal weight. She is not ill-appearing, toxic-appearing or diaphoretic.  HENT:     Head: Normocephalic and atraumatic.     Right Ear: Tympanic membrane, ear canal and external ear normal. No drainage or tenderness. No middle ear effusion. There is no impacted cerumen. Tympanic membrane is not erythematous or bulging.     Left Ear: Tympanic membrane, ear canal and external ear normal. No drainage or tenderness.  No middle ear effusion. There is no impacted cerumen. Tympanic membrane is not erythematous or bulging.     Nose: Nose normal. No congestion or rhinorrhea.     Mouth/Throat:     Mouth: Mucous membranes are moist. No oral lesions.     Pharynx: No pharyngeal swelling, oropharyngeal exudate, posterior oropharyngeal erythema or uvula swelling.     Tonsils: No tonsillar exudate or tonsillar abscesses.  Eyes:     General: No scleral icterus.       Right eye: No discharge.        Left eye: No discharge.     Extraocular Movements: Extraocular movements intact.     Right eye: Normal extraocular motion.  Left eye: Normal extraocular motion.     Conjunctiva/sclera: Conjunctivae normal.  Cardiovascular:     Rate and Rhythm: Normal rate and regular rhythm.     Heart sounds: Normal heart sounds. No murmur heard.    No friction rub. No gallop.  Pulmonary:     Effort: Pulmonary effort is normal. No respiratory distress.     Breath sounds: No stridor. No wheezing, rhonchi or rales.  Chest:     Chest wall: No tenderness.  Musculoskeletal:     Cervical back: Normal range of motion and neck supple.  Lymphadenopathy:     Cervical: No cervical adenopathy.  Skin:    General: Skin is warm and dry.  Neurological:     General: No focal deficit present.     Mental Status: She is alert and oriented to person, place, and  time.  Psychiatric:        Mood and Affect: Mood normal.        Behavior: Behavior normal.     Assessment and Plan :   PDMP not reviewed this encounter.  1. Viral respiratory illness   2. Mild intermittent asthma without complication   3. Allergic rhinitis, unspecified seasonality, unspecified trigger     Deferred imaging given clear cardiopulmonary exam, hemodynamically stable vital signs.  In the context of her asthma and allergic rhinitis, recommended a oral prednisone course.  I refilled her albuterol inhaler. Suspect viral URI, viral syndrome. Physical exam findings reassuring and vital signs stable for discharge. Advised supportive care, offered symptomatic relief. Counseled patient on potential for adverse effects with medications prescribed/recommended today, ER and return-to-clinic precautions discussed, patient verbalized understanding.     Wallis Bamberg, PA-C 12/26/21 1338

## 2021-12-26 NOTE — ED Triage Notes (Signed)
Pt reports an uri that she ignored for to long.Congestion, chest pain, green mucus, and coughing which started 4-6 days ago. Took zrytec d which provided some relief.

## 2022-02-26 ENCOUNTER — Ambulatory Visit: Payer: Commercial Managed Care - PPO | Admitting: Medical

## 2022-02-26 VITALS — BP 126/80 | HR 60 | Resp 18 | Ht 63.0 in | Wt 128.6 lb

## 2022-02-26 DIAGNOSIS — Z23 Encounter for immunization: Secondary | ICD-10-CM

## 2022-02-26 DIAGNOSIS — M26609 Unspecified temporomandibular joint disorder, unspecified side: Secondary | ICD-10-CM | POA: Diagnosis not present

## 2022-02-26 MED ORDER — ALBUTEROL SULFATE HFA 108 (90 BASE) MCG/ACT IN AERS: 2.0000 | INHALATION_SPRAY | Freq: Four times a day (QID) | RESPIRATORY_TRACT | 0 refills | Status: DC | PRN

## 2022-02-26 MED ORDER — CYCLOBENZAPRINE HCL 5 MG PO TABS: 5.0000 mg | ORAL_TABLET | Freq: Every day | ORAL | 0 refills | Status: DC

## 2022-02-26 NOTE — Progress Notes (Signed)
Subjective:    Patient ID: Lori Benton, female    DOB: December 13, 1990, 32 y.o.   MRN: [id number]  HPI Pt in with left jaw pain. Some pain on eating/chewing. Pain is on left side. She states pain is not improving/about the same. She opens her mouth and feels click. She also states can't open mouth very wide.  No fever, no chills or sweats.  Last saw dentist in march of last year.  Pt has taken tylenol and ibuprofen.  One event of tmj after she had wisdom teeth removed.   No chewing ice, gum or hard candies.  Review of Systems  Constitutional:  Negative for chills, fatigue and fever.  HENT:         Jaw pain in area adjacent to left tmj region.  Respiratory:  Negative for cough, chest tightness and wheezing.   Cardiovascular:  Negative for chest pain and palpitations.  Gastrointestinal:  Negative for abdominal pain and blood in stool.  Genitourinary:  Negative for dysuria and flank pain.    Past Medical History:  Diagnosis Date   Hemochromatosis      Social History   Socioeconomic History   Marital status: Married    Spouse name: Not on file   Number of children: Not on file   Years of education: Not on file   Highest education level: Not on file  Occupational History   Not on file  Tobacco Use   Smoking status: Never   Smokeless tobacco: Never  Vaping Use   Vaping Use: Never used  Substance and Sexual Activity   Alcohol use: Yes    Alcohol/week: 7.0 standard drinks of alcohol    Types: 7 Shots of liquor per week    Comment: 7-8 shots on average every 2 weeks.   Drug use: Never   Sexual activity: Yes  Other Topics Concern   Not on file  Social History Narrative   Not on file   Social Determinants of Health   Financial Resource Strain: Not on file  Food Insecurity: Not on file  Transportation Needs: Not on file  Physical Activity: Not on file  Stress: Not on file  Social Connections: Not on file  Intimate Partner Violence: Not on file    Past  Surgical History:  Procedure Laterality Date   c section      Family History  Problem Relation Age of Onset   Hypertension Father    High Cholesterol Father    Anxiety disorder Father    Depression Father    Depression Mother    Anxiety disorder Mother    High Cholesterol Mother    Hypertension Mother     Allergies  Allergen Reactions   Celery (Apium Graveolens Var. Dulce) Skin Test Hives, Shortness Of Breath, Itching and Cough   Sulfa Antibiotics Anaphylaxis   Medrol [Methylprednisolone] Rash    Current Outpatient Medications on File Prior to Visit  Medication Sig Dispense Refill   albuterol (VENTOLIN HFA) 108 (90 Base) MCG/ACT inhaler Inhale 2 puffs into the lungs every 6 (six) hours as needed. 18 g 0   doxylamine, Sleep, (UNISOM) 25 MG tablet Take 25 mg by mouth at bedtime as needed for sleep.     predniSONE (DELTASONE) 50 MG tablet Take 1 tablet (50 mg total) by mouth daily with breakfast. 5 tablet 0   promethazine-dextromethorphan (PROMETHAZINE-DM) 6.25-15 MG/5ML syrup Take 5 mLs by mouth 3 (three) times daily as needed for cough. 100 mL 0   No current facility-administered  medications on file prior to visit.    BP 126/80   Pulse 60   Resp 18   Ht 5\' 3"  (1.6 m)   Wt 128 lb 9.6 oz (58.3 kg)   SpO2 100%   BMI 22.78 kg/m        Objective:   Physical Exam   General- No acute distress. Pleasant patient. Neck- Full range of motion, no jvd Lungs- Clear, even and unlabored. Heart- regular rate and rhythm. Neurologic- CNII- XII grossly intact.  Heent- no sinus pressure. Posterior pharynx normal. Left lower teeth no obvious caries. Looks like 2 molars back side missing/extracted. Left jawline not tender except for area just in front of left tmj. No redness to skin on face. Ear canal clear. Normal tm. No posterior auricule region pain.     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 Advise continue ibuprofen and  low dose flexeril at night for next 3 nights. If pain worsens or changes let me know.  Presently does not appear that you have tooth infection.  If pain directly over tmj persisting advise be seen by dentist.  Follow up as needed.   Mackie Pai, PA-C

## 2022-02-26 NOTE — Addendum Note (Signed)
Addended by: Anabel Halon on: 02/26/2022 08:25 AM   Modules accepted: Orders

## 2022-02-26 NOTE — Patient Instructions (Signed)
Left side tmj syndrome likely as you describe recent popping in the tmj area. Advise continue ibuprofen and low dose flexeril at night for next 3 nights. If pain worsens or changes let me know.  Presently does not appear that you have tooth infection.  If pain directly over tmj persisting advise be seen by dentist.  Follow up as needed.    Temporomandibular Joint Syndrome  Temporomandibular joint syndrome (TMJ syndrome) is a condition that causes pain in the temporomandibular joints. These joints are located near your ears and allow your jaw to open and close. For people with TMJ syndrome, chewing, biting, or other movements of the jaw can be difficult or painful. TMJ syndrome is often mild and goes away within a few weeks. However, sometimes the condition becomes a long-term (chronic) problem. What are the causes? This condition may be caused by: Grinding your teeth or clenching your jaw. Some people do this when they are stressed. Arthritis. An injury to the jaw. A head or neck injury. Teeth or dentures that are not aligned well. In some cases, the cause of TMJ syndrome may not be known. What are the signs or symptoms? The most common symptom of this condition is aching pain on the side of the head in the area of the TMJ. Other symptoms may include: Pain when moving your jaw, such as when chewing or biting. Not being able to open your jaw all the way. Making a clicking sound when you open your mouth. Headache. Earache. Neck or shoulder pain. How is this diagnosed? This condition may be diagnosed based on: Your symptoms and medical history. A physical exam. Your health care provider may check the range of motion of your jaw. Imaging tests, such as X-rays or an MRI. You may also need to see your dentist, who will check if your teeth and jaw are lined up correctly. How is this treated? TMJ syndrome often goes away on its own. If treatment is needed, it may include: Eating soft  foods and applying ice or heat. Medicines to relieve pain or inflammation. Medicines or massage to relax the muscles. A splint, bite plate, or mouthpiece to prevent teeth grinding or jaw clenching. Relaxation techniques or counseling to help reduce stress. A therapy for pain in which an electrical current is applied to the nerves through the skin (transcutaneous electrical nerve stimulation). Acupuncture. This may help to relieve pain. Jaw surgery. This is rarely needed. Follow these instructions at home:  Eating and drinking Eat a soft diet if you are having trouble chewing. Avoid foods that require a lot of chewing. Do not chew gum. General instructions Take over-the-counter and prescription medicines only as told by your health care provider. If directed, put ice on the painful area. To do this: Put ice in a plastic bag. Place a towel between your skin and the bag. Leave the ice on for 20 minutes, 2-3 times a day. Remove the ice if your skin turns bright red. This is very important. If you cannot feel pain, heat, or cold, you have a greater risk of damage to the area. Apply a warm, wet cloth (warm compress) to the painful area as told. Massage your jaw area and do any jaw stretching exercises as told by your health care provider. If you were given a splint, bite plate, or mouthpiece, wear it as told by your health care provider. Keep all follow-up visits. This is important. Where to find more information [employer]:  [email or web address]://avila-olson.[email or web address]/ Contact a health care provider if: You have trouble eating. You have new or worsening symptoms. Get help right away if: Your jaw locks. Summary Temporomandibular joint syndrome (TMJ syndrome) is a condition that causes pain in the temporomandibular joints. These joints are located near your ears and allow your jaw to open and close. TMJ syndrome is often mild and goes away within a few weeks. However,  sometimes the condition becomes a long-term (chronic) problem. Symptoms include an aching pain on the side of the head in the area of the TMJ, pain when chewing or biting, and being unable to open your jaw all the way. You may also make a clicking sound when you open your mouth. TMJ syndrome often goes away on its own. If treatment is needed, it may include medicines to relieve pain, reduce inflammation, or relax the muscles. A splint, bite plate, or mouthpiece may also be used to prevent teeth grinding or jaw clenching. This information is not intended to replace advice given to you by your health care provider. Make sure you discuss any questions you have with your health care provider. Document Revised: 09/01/2020 Document Reviewed: 09/01/2020 Elsevier Patient Education  [employer].

## 2022-02-26 NOTE — Addendum Note (Signed)
Addended by: Jeronimo Greaves on: 02/26/2022 08:30 AM   Modules accepted: Orders

## 2022-04-14 ENCOUNTER — Encounter: Payer: Self-pay | Admitting: General Practice

## 2022-05-25 ENCOUNTER — Ambulatory Visit: Payer: BLUE CROSS/BLUE SHIELD | Admitting: Family

## 2022-05-25 ENCOUNTER — Inpatient Hospital Stay: Payer: 59

## 2022-05-27 ENCOUNTER — Encounter: Payer: Self-pay | Admitting: Family

## 2022-05-27 ENCOUNTER — Inpatient Hospital Stay (HOSPITAL_BASED_OUTPATIENT_CLINIC_OR_DEPARTMENT_OTHER): Payer: 59 | Admitting: Family

## 2022-05-27 ENCOUNTER — Other Ambulatory Visit: Payer: Self-pay

## 2022-05-27 ENCOUNTER — Inpatient Hospital Stay: Payer: 59 | Attending: Family

## 2022-05-27 LAB — CMP (CANCER CENTER ONLY)
ALT: 9 U/L (ref 0–44)
AST: 14 U/L — ABNORMAL LOW (ref 15–41)
Albumin: 4.2 g/dL (ref 3.5–5.0)
Alkaline Phosphatase: 73 U/L (ref 38–126)
Anion gap: 12 (ref 5–15)
BUN: 13 mg/dL (ref 6–20)
CO2: 25 mmol/L (ref 22–32)
Calcium: 9.3 mg/dL (ref 8.9–10.3)
Chloride: 103 mmol/L (ref 98–111)
Creatinine: 1.1 mg/dL — ABNORMAL HIGH (ref 0.44–1.00)
GFR, Estimated: >60 mL/min (ref >60)
Glucose, Bld: 125 mg/dL — ABNORMAL HIGH (ref 70–99)
Potassium: 4.1 mmol/L (ref 3.5–5.1)
Sodium: 140 mmol/L (ref 135–145)
Total Bilirubin: 0.5 mg/dL (ref 0.3–1.2)
Total Protein: 7.3 g/dL (ref 6.5–8.1)

## 2022-05-27 LAB — CBC WITH DIFFERENTIAL (CANCER CENTER ONLY)
Abs Immature Granulocytes: 0.02 10*3/uL (ref 0.00–0.07)
Basophils Absolute: 0 10*3/uL (ref 0.0–0.1)
Basophils Relative: 1 %
Eosinophils Absolute: 0.3 10*3/uL (ref 0.0–0.5)
Eosinophils Relative: 5 %
HCT: 41.2 % (ref 36.0–46.0)
Hemoglobin: 13.9 g/dL (ref 12.0–15.0)
Immature Granulocytes: 0 %
Lymphocytes Relative: 40 %
Lymphs Abs: 2.4 10*3/uL (ref 0.7–4.0)
MCH: 32.6 pg (ref 26.0–34.0)
MCHC: 33.7 g/dL (ref 30.0–36.0)
MCV: 96.5 fL (ref 80.0–100.0)
Monocytes Absolute: 0.3 10*3/uL (ref 0.1–1.0)
Monocytes Relative: 6 %
Neutro Abs: 2.9 10*3/uL (ref 1.7–7.7)
Neutrophils Relative %: 48 %
Platelet Count: 268 10*3/uL (ref 150–400)
RBC: 4.27 MIL/uL (ref 3.87–5.11)
RDW: 12.7 % (ref 11.5–15.5)
WBC Count: 6 10*3/uL (ref 4.0–10.5)
nRBC: 0 % (ref 0.0–0.2)

## 2022-05-27 LAB — IRON AND IRON BINDING CAPACITY (CC-WL,HP ONLY)
Iron: 145 ug/dL (ref 28–170)
Saturation Ratios: 45 % — ABNORMAL HIGH (ref 10.4–31.8)
TIBC: 326 ug/dL (ref 250–450)
UIBC: 181 ug/dL (ref 148–442)

## 2022-05-27 LAB — FERRITIN: Ferritin: 11 ng/mL (ref 11–307)

## 2022-05-27 NOTE — Progress Notes (Signed)
Hematology and Oncology Follow Up Visit  Lori Benton [id number] 10-26-1990 32 y.o. 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 She is doing well but does note some fatigue. She attributes this to a recent shift change.  No bruising or petechiae.  Cycle regular and flow normal.  No fever, chills, n/v, cough, rash, dizziness, SOB, chest pain, palpitations, abdominal pain or changes in bowel or bladder habits.  No swelling, tenderness, numbness or tingling in her extremities.  No falls or syncope.  Appetite and hydration are good. Weight is stable at 128 lbs.   ECOG Performance Status: 1 - Symptomatic but completely ambulatory  Medications:  Allergies as of 05/27/2022       Reactions   Celery (apium Graveolens Var. Dulce) Skin Test Hives, Shortness Of Breath, Itching, Cough   Sulfa Antibiotics Anaphylaxis   Medrol [methylprednisolone] Rash        Medication List        Accurate as of May 27, 2022 10:32 AM. If you have any questions, ask your nurse or doctor.          STOP taking these medications    cyclobenzaprine 5 MG tablet Commonly known as: FLEXERIL Stopped by: Eileen Stanford, NP   predniSONE 50 MG tablet Commonly known as: DELTASONE Stopped by: Eileen Stanford, NP   promethazine-dextromethorphan 6.25-15 MG/5ML syrup Commonly known as: PROMETHAZINE-DM Stopped by: Eileen Stanford, NP       TAKE these medications    albuterol 108 (90 Base) MCG/ACT inhaler Commonly known as: VENTOLIN HFA Inhale 2 puffs into the lungs every 6 (six) hours as needed.   UNISOM PO Take 1 tablet by mouth as needed. What changed: Another medication with the same name was removed. Continue taking this medication, and  follow the directions you see here. Changed by: Eileen Stanford, NP        Allergies:  Allergies  Allergen Reactions   Celery (Apium Graveolens Var. Dulce) Skin Test Hives, Shortness Of Breath, Itching and Cough   Sulfa Antibiotics Anaphylaxis   Medrol [Methylprednisolone] Rash    Past Medical History, Surgical history, Social history, and Family History were reviewed and updated.  Review of Systems: All other 10 point review of systems is negative.   Physical Exam:  height is  (1.6 m) and weight is 128 lb 8 oz (58.3 kg). Her oral temperature is 97.8 F (36.6 C). Her blood pressure is 123/85 and her pulse is 73. Her respiration is 17 and oxygen saturation is 1% (abnormal).   Wt Readings from Last 3 Encounters:  05/27/22 128 lb 8 oz (58.3 kg)  02/26/22 128 lb 9.6 oz (58.3 kg)  11/26/21 126 lb 1.3 oz (57.2 kg)    Ocular: Sclerae unicteric, pupils equal, round and reactive to light Ear-nose-throat: Oropharynx clear, dentition fair Lymphatic: No cervical or supraclavicular adenopathy Lungs no rales or rhonchi, good excursion bilaterally Heart regular rate and rhythm, no murmur appreciated Abd soft, nontender, positive bowel sounds MSK no focal spinal tenderness, no joint edema Neuro: non-focal, well-oriented, appropriate affect Breasts: Deferred   Lab Results  Component Value Date   WBC 6.0 05/27/2022   HGB 13.9 05/27/2022   HCT 41.2 05/27/2022   MCV  96.5 05/27/2022   PLT 268 05/27/2022   Lab Results  Component Value Date   FERRITIN 11 11/26/2021   IRON 106 11/26/2021   TIBC 344 11/26/2021   UIBC 238 11/26/2021   IRONPCTSAT 31 11/26/2021   Lab Results  Component Value Date   RETICCTPCT 1.9 02/04/2021   RBC 4.27 05/27/2022   No results found for: "KPAFRELGTCHN", "LAMBDASER", "KAPLAMBRATIO" No results found for: "IGGSERUM", "IGA", "IGMSERUM" No results found for: "TOTALPROTELP", "ALBUMINELP", "A1GS", "A2GS", "BETS", "BETA2SER", "GAMS", "MSPIKE", "SPEI"    Chemistry      Component Value Date/Time   NA 137 11/26/2021 1106   K 3.9 11/26/2021 1106   CL 104 11/26/2021 1106   CO2 26 11/26/2021 1106   BUN 14 11/26/2021 1106   CREATININE 1.10 (H) 11/26/2021 1106      Component Value Date/Time   CALCIUM 9.1 11/26/2021 1106   ALKPHOS 76 11/26/2021 1106   AST 17 11/26/2021 1106   ALT 14 11/26/2021 1106   BILITOT 0.5 11/26/2021 1106       Impression and Plan:  Lori Benton is a very pleasant 32 yo caucasian female with history of hemochromatosis diagnosed in 2010.  Iron studies are pending. She will donate with the [facility] if needed.  Follow-up in 6 months.  Eileen Stanford, NP 4/24/202410:32 AM

## 2022-07-08 ENCOUNTER — Encounter: Payer: Self-pay | Admitting: Family

## 2022-10-10 IMAGING — CR DG HIP (WITH OR WITHOUT PELVIS) 2-3V*L*
3 series · 3 of 3 positions shown · non-contrast
Comparison: None.

CLINICAL DATA: [age] female with history of acute onset left
hip pain.

EXAM:
DG HIP (WITH OR WITHOUT PELVIS) 2-3V LEFT

[t pelvis a.p.]
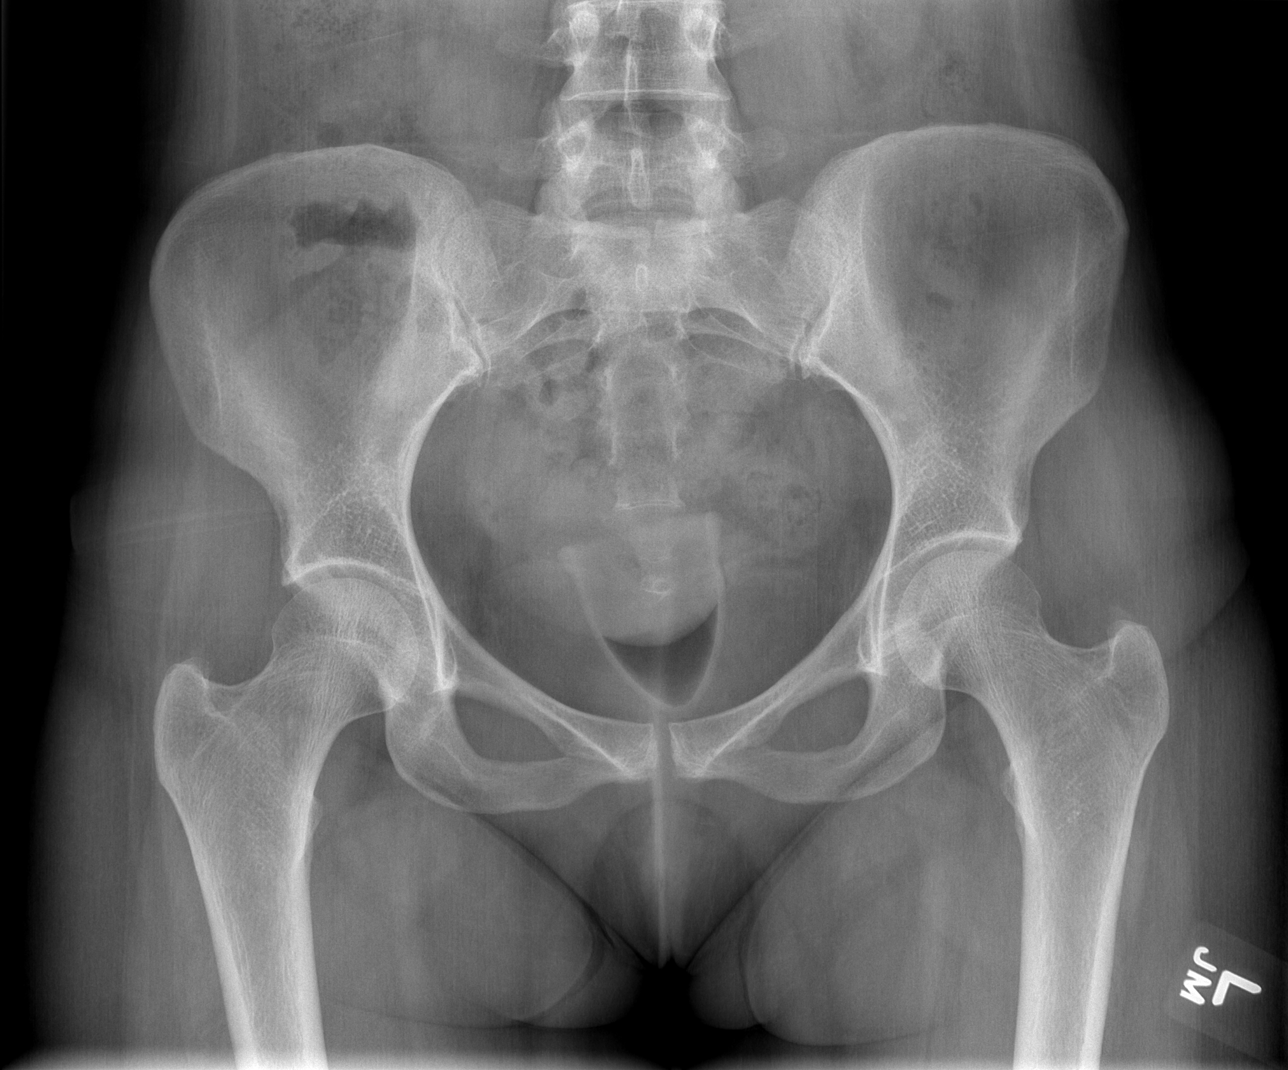

[t hip ap left]
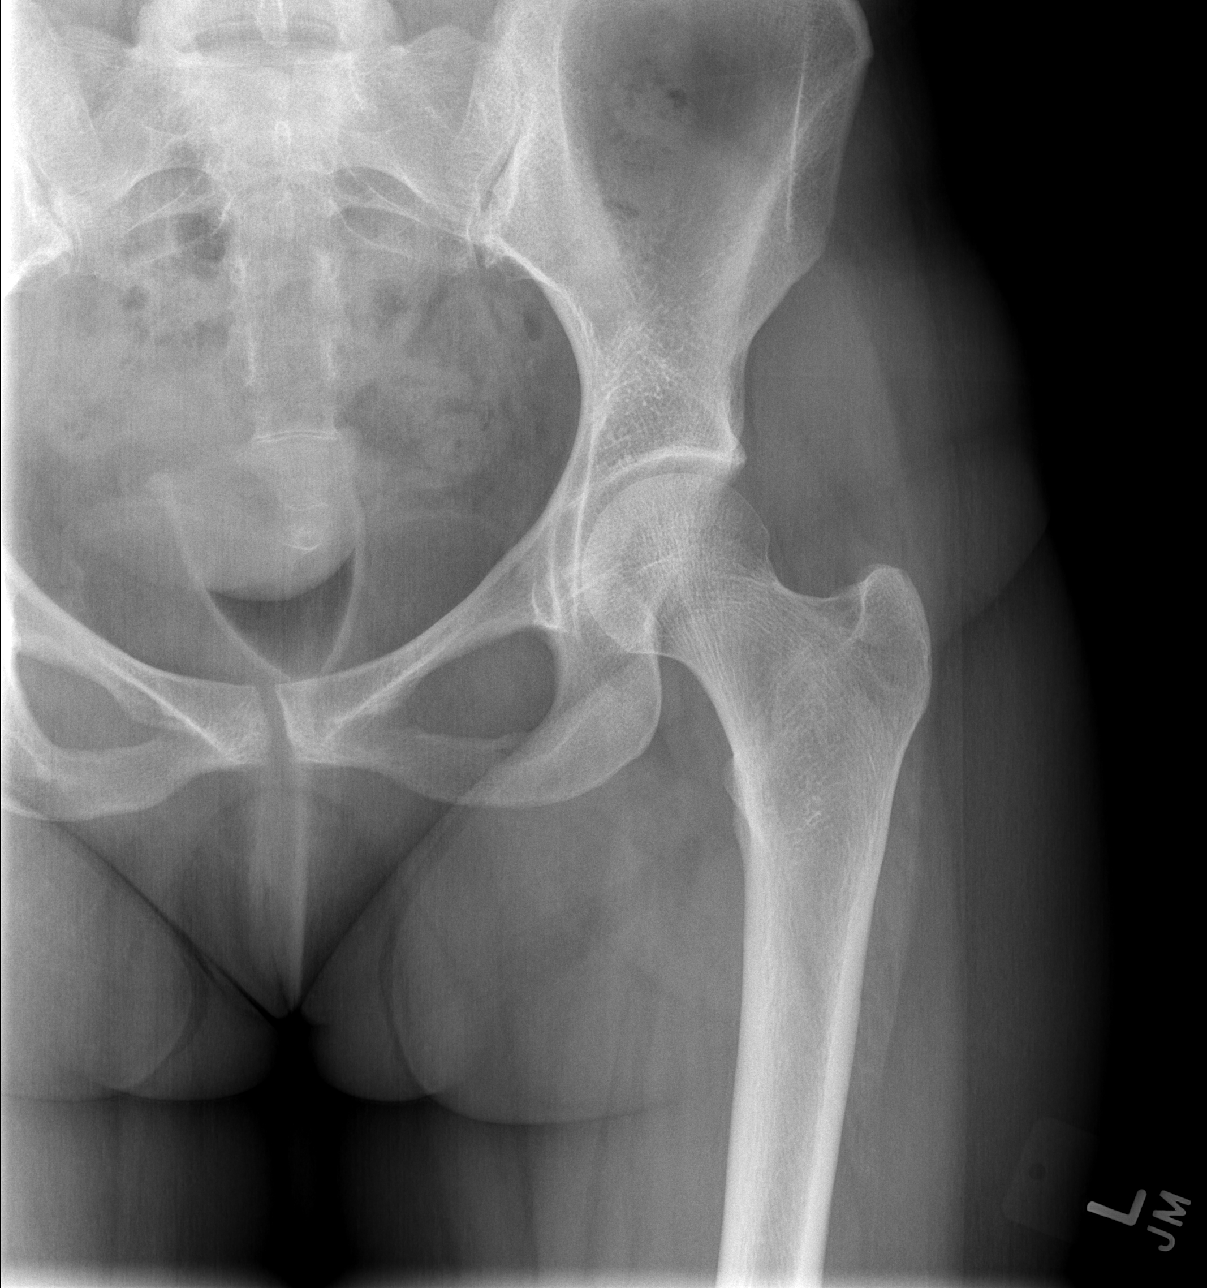

[t hip frog leg left]
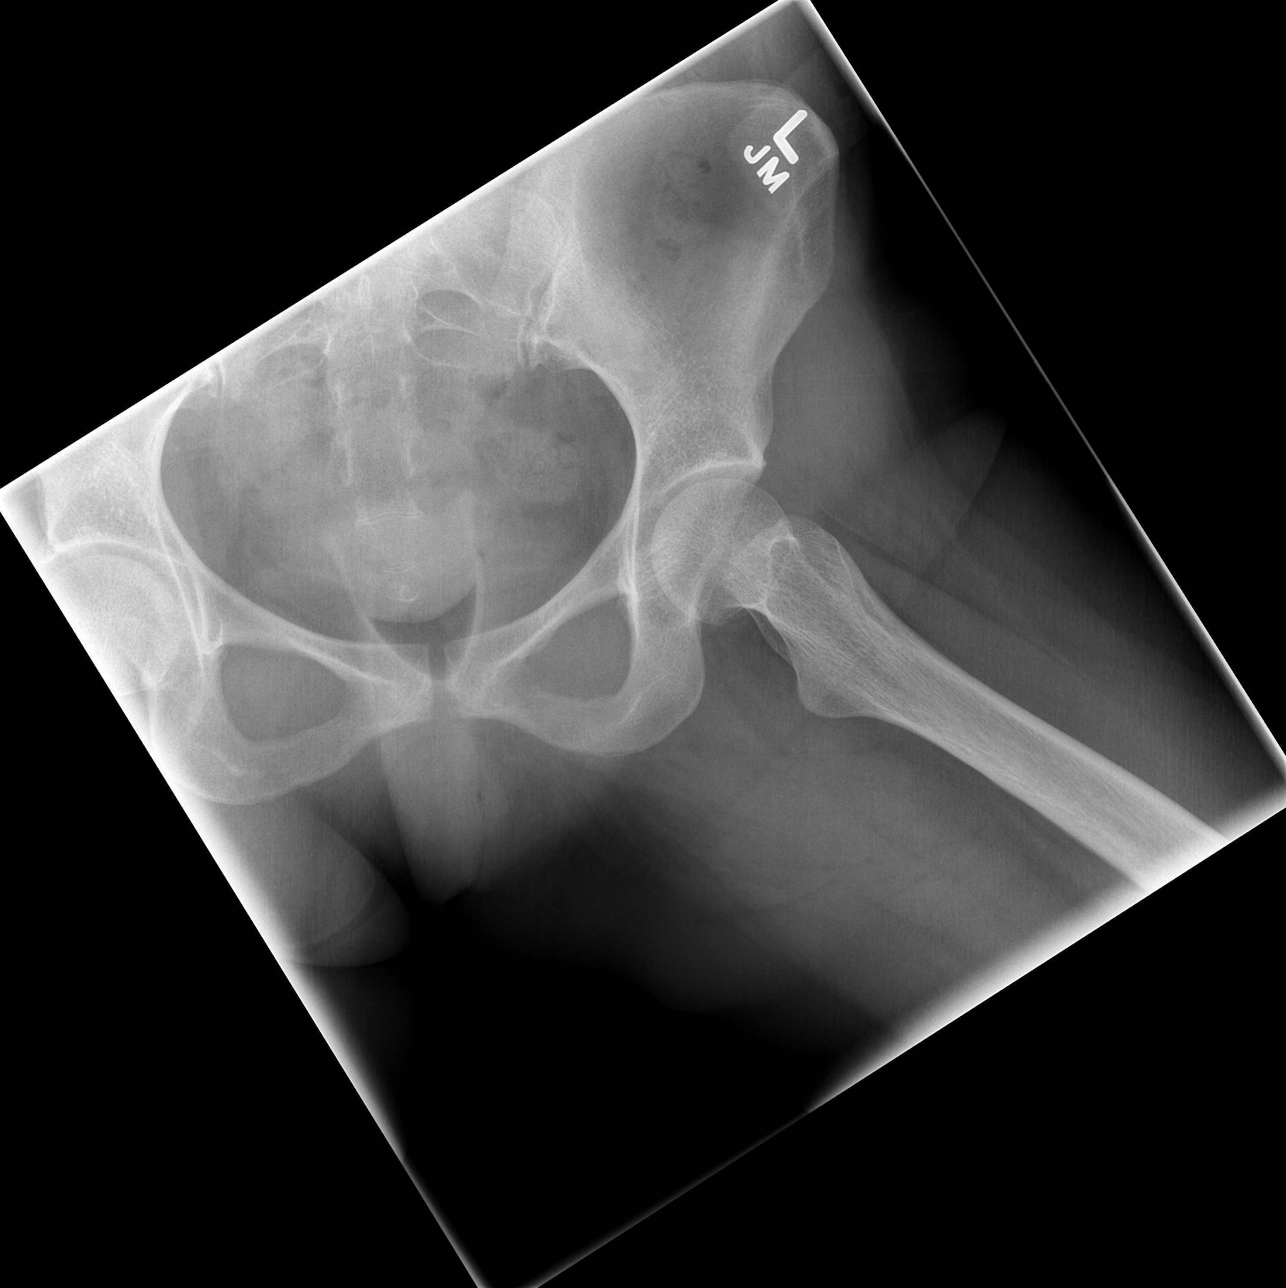

[3 of 3 positions shown; findings below may reference images not displayed]

FINDINGS: There is no evidence of hip fracture or dislocation. There is no
evidence of arthropathy or other focal bone abnormality.
IMPRESSION: No acute fracture, malalignment, or significant degenerative change.

## 2022-11-08 ENCOUNTER — Ambulatory Visit
Admission: EM | Admit: 2022-11-08 | Discharge: 2022-11-08 | Disposition: A | Discharge status: Home / Self Care | Payer: 59 | Attending: Internal Medicine | Admitting: Internal Medicine

## 2022-11-08 DIAGNOSIS — H60502 Unspecified acute noninfective otitis externa, left ear: Secondary | ICD-10-CM

## 2022-11-08 MED ORDER — OFLOXACIN 0.3 % OT SOLN
5.0000 [drp] | Freq: Two times a day (BID) | OTIC | 0 refills | Status: AC
Start: 2022-11-08 — End: 2022-11-15

## 2022-11-08 NOTE — ED Provider Notes (Signed)
[facility]    CSN: [id number] Arrival date & time: 11/08/22  1221      History   Chief Complaint No chief complaint on file.   HPI Lori Benton is a 32 y.o. female presents for evaluation of ear pain.  Patient reports 3 days of left ear pain with muffled hearing and minimal drainage.  Denies any fevers, cough/congestion.  No recent swimming.  Does use ear buds a lot.  She tried to flush it out with peroxide with no improvement.  No other concerns at this time.  HPI  Past Medical History:  Diagnosis Date   Hemochromatosis     There are no problems to display for this patient.   Past Surgical History:  Procedure Laterality Date   c section      OB History     Gravida  3   Para  3   Term  3   Preterm      AB      Living  3      SAB      IAB      Ectopic      Multiple      Live Births  3            Home Medications    Prior to Admission medications   Medication Sig Start Date End Date Taking? Authorizing Provider  ofloxacin (FLOXIN) 0.3 % OTIC solution Place 5 drops into the left ear 2 (two) times daily for 7 days. 11/08/22 11/15/22 Yes Radford Pax, NP  albuterol (VENTOLIN HFA) 108 (90 Base) MCG/ACT inhaler Inhale 2 puffs into the lungs every 6 (six) hours as needed. 02/26/22   Saguier, Ramon Dredge, PA-C  Doxylamine Succinate, Sleep, (UNISOM PO) Take 1 tablet by mouth as needed. 04/03/18   [provider]    Family History Family History  Problem Relation Age of Onset   Hypertension Father    High Cholesterol Father    Anxiety disorder Father    Depression Father    Depression Mother    Anxiety disorder Mother    High Cholesterol Mother    Hypertension Mother     Social History Social History   Tobacco Use   Smoking status: Never   Smokeless tobacco: Never  Vaping Use   Vaping status: Never Used  Substance Use Topics   Alcohol use: Yes    Alcohol/week: 7.0 standard drinks of alcohol    Types: 7 Shots of  liquor per week    Comment: 7-8 shots on average every 2 weeks.   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      Physical Exam Triage Vital Signs ED Triage Vitals  Encounter Vitals Group     BP 11/08/22 1420 (!) 149/98     Systolic BP Percentile --      Diastolic BP Percentile --      Pulse Rate 11/08/22 1420 74     Resp 11/08/22 1420 16     Temp 11/08/22 1420 98.4 F (36.9 C)     Temp Source 11/08/22 1420 Oral     SpO2 11/08/22 1420 98 %     Weight --      Height --      Head Circumference --      Peak Flow --  Pain Score 11/08/22 1419 3     Pain Loc --      Pain Education --      Exclude from Growth Chart --    No data found.  Updated Vital Signs BP (!) 149/98 (BP Location: Left Arm)   Pulse 74   Temp 98.4 F (36.9 C) (Oral)   Resp 16   SpO2 98%   Visual Acuity Right Eye Distance:   Left Eye Distance:   Bilateral Distance:    Right Eye Near:   Left Eye Near:    Bilateral Near:     Physical Exam Vitals and nursing note reviewed.  Constitutional:      General: She is not in acute distress.    Appearance: Normal appearance. She is not ill-appearing.  HENT:     Head: Normocephalic and atraumatic.     Right Ear: Tympanic membrane and ear canal normal.     Left Ear: Tympanic membrane normal. Swelling present. No drainage.  No middle ear effusion. There is no impacted cerumen. No foreign body. No hemotympanum. Tympanic membrane is not perforated or erythematous.  Eyes:     Pupils: Pupils are equal, round, and reactive to light.  Cardiovascular:     Rate and Rhythm: Normal rate.  Pulmonary:     Effort: Pulmonary effort is normal.  Skin:    General: Skin is warm and dry.  Neurological:     General: No focal deficit present.     Mental Status: She is alert and oriented to  person, place, and time.  Psychiatric:        Mood and Affect: Mood normal.        Behavior: Behavior normal.      UC Treatments / Results  Labs (all labs ordered are listed, but only abnormal results are displayed) Labs Reviewed - No data to display  EKG   Radiology No results found.  Procedures Procedures (including critical care time)  Medications Ordered in UC Medications - No data to display  Initial Impression / Assessment and Plan / UC Course  I have reviewed the triage vital signs and the nursing notes.  Pertinent labs & imaging results that were available during my care of the patient were reviewed by me and considered in my medical decision making (see chart for details).     Reviewed exam and symptoms with patient.  No red flags.  Start ofloxacin antibiotic eardrops.  Avoid steroid drops as patient has an allergy with rash to methylprednisone.  Advised to keep water out of the ear until treatment is complete.  No Q-tips or earbuds.  PCP follow-up if symptoms do not improve.  ER precautions reviewed. Final Clinical Impressions(s) / UC Diagnoses   Final diagnoses:  Acute otitis externa of left ear, unspecified type     Discharge Instructions      Start ofloxacin antibiotic eardrops twice a day as prescribed for 7 days.  Keep water out of the ear and to you have completed treatment.  No earbuds or Q-tips.  Please follow-up with your PCP if your symptoms do not improve.  Please go to the ER if you develop any worsening symptoms.  I hope you feel better soon!    ED Prescriptions     Medication Sig Dispense Auth. Provider   ofloxacin (FLOXIN) 0.3 % OTIC solution Place 5 drops into the left ear 2 (two) times daily for 7 days. 5 mL Radford Pax, NP      PDMP not  reviewed this encounter.   Radford Pax, NP 11/08/22 1453

## 2022-11-08 NOTE — ED Triage Notes (Signed)
Pt presents to UC w/ c/o left ear pain, minimal drainage x3 days.

## 2022-11-08 NOTE — Discharge Instructions (Signed)
Start ofloxacin antibiotic eardrops twice a day as prescribed for 7 days.  Keep water out of the ear and to you have completed treatment.  No earbuds or Q-tips.  Please follow-up with your PCP if your symptoms do not improve.  Please go to the ER if you develop any worsening symptoms.  I hope you feel better soon!

## 2022-11-25 ENCOUNTER — Other Ambulatory Visit: Payer: 59

## 2022-11-25 ENCOUNTER — Ambulatory Visit: Payer: 59 | Admitting: Family

## 2022-11-30 ENCOUNTER — Inpatient Hospital Stay (HOSPITAL_BASED_OUTPATIENT_CLINIC_OR_DEPARTMENT_OTHER): Payer: 59 | Admitting: Medical Oncology

## 2022-11-30 ENCOUNTER — Inpatient Hospital Stay: Payer: 59 | Attending: Hematology & Oncology

## 2022-11-30 ENCOUNTER — Encounter: Payer: Self-pay | Admitting: Medical Oncology

## 2022-11-30 LAB — CMP (CANCER CENTER ONLY)
ALT: 13 U/L (ref 0–44)
AST: 15 U/L (ref 15–41)
Albumin: 4.7 g/dL (ref 3.5–5.0)
Alkaline Phosphatase: 87 U/L (ref 38–126)
Anion gap: 10 (ref 5–15)
BUN: 10 mg/dL (ref 6–20)
CO2: 32 mmol/L (ref 22–32)
Calcium: 9.3 mg/dL (ref 8.9–10.3)
Chloride: 102 mmol/L (ref 98–111)
Creatinine: 0.99 mg/dL (ref 0.44–1.00)
GFR, Estimated: >60 mL/min (ref >60)
Glucose, Bld: 91 mg/dL (ref 70–99)
Potassium: 3.9 mmol/L (ref 3.5–5.1)
Sodium: 144 mmol/L (ref 135–145)
Total Bilirubin: 0.3 mg/dL (ref 0.3–1.2)
Total Protein: 6.8 g/dL (ref 6.5–8.1)

## 2022-11-30 LAB — CBC WITH DIFFERENTIAL (CANCER CENTER ONLY)
Abs Immature Granulocytes: 0.02 10*3/uL (ref 0.00–0.07)
Basophils Absolute: 0.1 10*3/uL (ref 0.0–0.1)
Basophils Relative: 1 %
Eosinophils Absolute: 0.7 10*3/uL — ABNORMAL HIGH (ref 0.0–0.5)
Eosinophils Relative: 8 %
HCT: 39.7 % (ref 36.0–46.0)
Hemoglobin: 13.2 g/dL (ref 12.0–15.0)
Immature Granulocytes: 0 %
Lymphocytes Relative: 32 %
Lymphs Abs: 2.8 10*3/uL (ref 0.7–4.0)
MCH: 32.7 pg (ref 26.0–34.0)
MCHC: 33.2 g/dL (ref 30.0–36.0)
MCV: 98.3 fL (ref 80.0–100.0)
Monocytes Absolute: 0.5 10*3/uL (ref 0.1–1.0)
Monocytes Relative: 6 %
Neutro Abs: 4.6 10*3/uL (ref 1.7–7.7)
Neutrophils Relative %: 53 %
Platelet Count: 343 10*3/uL (ref 150–400)
RBC: 4.04 MIL/uL (ref 3.87–5.11)
RDW: 12.3 % (ref 11.5–15.5)
WBC Count: 8.7 10*3/uL (ref 4.0–10.5)
nRBC: 0 % (ref 0.0–0.2)

## 2022-11-30 LAB — IRON AND IRON BINDING CAPACITY (CC-WL,HP ONLY)
Iron: 41 ug/dL (ref 28–170)
Saturation Ratios: 13 % (ref 10.4–31.8)
TIBC: 321 ug/dL (ref 250–450)
UIBC: 280 ug/dL (ref 148–442)

## 2022-11-30 LAB — FERRITIN: Ferritin: 18 ng/mL (ref 11–307)

## 2022-11-30 NOTE — Progress Notes (Signed)
Hematology and Oncology Follow Up Visit  Lori Benton [id number] [birth year] 32 y.o. 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   She reports that she has been doing well. She has no concerns today.  No bruising or petechiae.  Cycle regular and flow normal.  No fever, chills, n/v, cough, rash, dizziness, SOB, chest pain, palpitations, abdominal pain or changes in bowel or bladder habits.  No swelling, tenderness, numbness or tingling in her extremities.  No falls or syncope.  Appetite and hydration are good.   ECOG Performance Status: 1 - Symptomatic but completely ambulatory  Medications:  Allergies as of 11/30/2022       Reactions   Celery (apium Graveolens Var. Dulce) Skin Test Hives, Shortness Of Breath, Itching, Cough   Sulfa Antibiotics Anaphylaxis   Medrol [methylprednisolone] Rash        Medication List        Accurate as of November 30, 2022  9:26 AM. If you have any questions, ask your nurse or doctor.          albuterol 108 (90 Base) MCG/ACT inhaler Commonly known as: VENTOLIN HFA Inhale 2 puffs into the lungs every 6 (six) hours as needed.   UNISOM PO Take 1 tablet by mouth as needed.        Allergies:  Allergies  Allergen Reactions   Celery (Apium Graveolens Var. Dulce) Skin Test Hives, Shortness Of Breath, Itching and Cough   Sulfa Antibiotics Anaphylaxis   Medrol [Methylprednisolone] Rash    Past Medical History, Surgical history, Social history, and Family History were reviewed and updated.  Review of Systems: All other 10 point review of systems is negative.   Physical Exam:  weight is 114 lb (51.7 kg). Her oral temperature is 98.5 F (36.9 C). Her  blood pressure is 138/91 (abnormal) and her pulse is 75. Her respiration is 17 and oxygen saturation is 100%.   Wt Readings from Last 3 Encounters:  11/30/22 114 lb (51.7 kg)  05/27/22 128 lb 8 oz (58.3 kg)  02/26/22 128 lb 9.6 oz (58.3 kg)    Ocular: Sclerae unicteric, pupils equal, round and reactive to light Ear-nose-throat: Oropharynx clear, dentition fair Lymphatic: No cervical or supraclavicular adenopathy Lungs no rales or rhonchi, good excursion bilaterally Heart regular rate and rhythm, no murmur appreciated Abd soft, nontender, positive bowel sounds MSK no focal spinal tenderness, no joint edema Neuro: non-focal, well-oriented, appropriate affect   Lab Results  Component Value Date   WBC 8.7 11/30/2022   HGB 13.2 11/30/2022   HCT 39.7 11/30/2022   MCV 98.3 11/30/2022   PLT 343 11/30/2022   Lab Results  Component Value Date   FERRITIN 11 05/27/2022   IRON 145 05/27/2022   TIBC 326 05/27/2022   UIBC 181 05/27/2022   IRONPCTSAT 45 (H) 05/27/2022   Lab Results  Component Value Date   RETICCTPCT 1.9 02/04/2021   RBC 4.04 11/30/2022   No results found for: "KPAFRELGTCHN", "LAMBDASER", "KAPLAMBRATIO" No results found for: "IGGSERUM", "IGA", "IGMSERUM" No results found for: "TOTALPROTELP", "ALBUMINELP", "A1GS", "A2GS", "BETS", "BETA2SER", "GAMS", "MSPIKE", "SPEI"   Chemistry      Component Value Date/Time   NA 140  05/27/2022 0959   K 4.1 05/27/2022 0959   CL 103 05/27/2022 0959   CO2 25 05/27/2022 0959   BUN 13 05/27/2022 0959   CREATININE 1.10 (H) 05/27/2022 0959      Component Value Date/Time   CALCIUM 9.3 05/27/2022 0959   ALKPHOS 73 05/27/2022 0959   AST 14 (L) 05/27/2022 0959   ALT 9 05/27/2022 0959   BILITOT 0.5 05/27/2022 0959      Encounter Diagnosis  Name Primary?   Hereditary hemochromatosis (HCC) Yes    Impression and Plan:  Lori Benton is a very pleasant 32 yo caucasian female with history of hemochromatosis diagnosed in 2010.   Iron  studies are pending. She will donate with the [facility] if needed.  RTC 6 months APP, labs (CBC, CMP, TSH, ferritin, iron)-[place]aden  Brand Males [place], PA-C 10/28/20249:26 AM

## 2022-12-01 LAB — TSH: TSH: 0.892 u[IU]/mL (ref 0.350–4.500)

## 2023-02-25 ENCOUNTER — Encounter: Payer: Self-pay | Admitting: Family

## 2023-02-26 ENCOUNTER — Encounter: Payer: Self-pay | Admitting: Medical

## 2023-02-26 ENCOUNTER — Ambulatory Visit (INDEPENDENT_AMBULATORY_CARE_PROVIDER_SITE_OTHER): Payer: Commercial Managed Care - PPO | Admitting: Medical

## 2023-02-26 VITALS — BP 140/82 | HR 70 | Resp 18 | Ht 63.0 in | Wt 115.0 lb

## 2023-02-26 DIAGNOSIS — E785 Hyperlipidemia, unspecified: Secondary | ICD-10-CM | POA: Diagnosis not present

## 2023-02-26 DIAGNOSIS — Z0000 Encounter for general adult medical examination without abnormal findings: Secondary | ICD-10-CM

## 2023-02-26 MED ORDER — ALBUTEROL SULFATE HFA 108 (90 BASE) MCG/ACT IN AERS: 2.0000 | INHALATION_SPRAY | Freq: Four times a day (QID) | RESPIRATORY_TRACT | 0 refills | Status: AC | PRN

## 2023-02-26 NOTE — Patient Instructions (Addendum)
For you wellness exam today I have ordered lipid panel future fasting. Cbc and cmp done thru hematologist.  Vaccine today pcv 20.  Recommend exercise and healthy diet.  We will let you know lab results as they come in.  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 Significant weight loss and insomnia due to overnight shift work. Excessive use of Unisom to manage sleep cycle. -Provided a doctor's note recommending a daytime shift for overall health improvement.  Asthma -Refill Ventolin inhaler.  Hemochromatosis -Continue current management plan with Hematology.  General Health Maintenance -Order lipid panel to be done on the day of Hematology appointment. Patient advised to fast prior to the test.   Below is my chart note I sent pt after her visit upon fishing note. On review of today visit note did you say you had lost 30 lbs? That is a lot. Over what period of time. You had associated with not eating due to work shift. You have note to start working normal shift. Once you start normal shift hopefully you will gain some of weight back. Please update me on your weight in one month. Need to confirm able to gain weight back. If not then please follow up with me in one month.  Preventive Care 33-48 Years Old, Female Preventive care refers to lifestyle choices and visits with your health care provider that can promote health and wellness. Preventive care visits are also called wellness exams. What can I expect for my preventive care visit? Counseling During your preventive care visit, your health care provider may ask about your: Medical history, including: Past medical problems. Family medical history. Pregnancy history. Current health, including: Menstrual cycle. Method of birth control. Emotional well-being. Home life and relationship well-being. Sexual activity and sexual  health. Lifestyle, including: Alcohol, nicotine or tobacco, and drug use. Access to firearms. Diet, exercise, and sleep habits. Work and work Astronomer. Sunscreen use. Safety issues such as seatbelt and bike helmet use. Physical exam Your health care provider may check your: Height and weight. These may be used to calculate your BMI (body mass index). BMI is a measurement that tells if you are at a healthy weight. Waist circumference. This measures the distance around your waistline. This measurement also tells if you are at a healthy weight and may help predict your risk of certain diseases, such as type 2 diabetes and high blood pressure. Heart rate and blood pressure. Body temperature. Skin for abnormal spots. What immunizations do I need?  Vaccines are usually given at various ages, according to a schedule. Your health care provider will recommend vaccines for you based on your age, medical history, and lifestyle or other factors, such as travel or where you work. What tests do I need? Screening Your health care provider may recommend screening tests for certain conditions. This may include: Pelvic exam and Pap test. Lipid and cholesterol levels. Diabetes screening. This is done by checking your blood sugar (glucose) after you have not eaten for a while (fasting). Hepatitis B test. Hepatitis C test. HIV (human immunodeficiency virus) test. STI (sexually transmitted infection) testing, if you are at risk. BRCA-related cancer screening. This may be done if you have a family history of breast, ovarian, tubal, or peritoneal cancers. Talk with your health care provider about your test results, treatment options, and if necessary, the need for more tests. Follow these instructions at home: Eating and drinking  Eat a  healthy diet that includes fresh fruits and vegetables, whole grains, lean protein, and low-fat dairy products. Take vitamin and mineral supplements as recommended by  your health care provider. Do not drink alcohol if: Your health care provider tells you not to drink. You are pregnant, may be pregnant, or are planning to become pregnant. If you drink alcohol: Limit how much you have to 0-1 drink a day. Know how much alcohol is in your drink. In the [place] one drink equals one 12 oz bottle of beer (355 mL), one 5 oz glass of wine (148 mL), or one 1 oz glass of hard liquor (44 mL). Lifestyle Brush your teeth every morning and night with fluoride toothpaste. Floss one time each day. Exercise for at least 30 minutes 5 or more days each week. Do not use any products that contain nicotine or tobacco. These products include cigarettes, chewing tobacco, and vaping devices, such as e-cigarettes. If you need help quitting, ask your health care provider. Do not use drugs. If you are sexually active, practice safe sex. Use a condom or other form of protection to prevent STIs. If you do not wish to become pregnant, use a form of birth control. If you plan to become pregnant, see your health care provider for a prepregnancy visit. Find healthy ways to manage stress, such as: Meditation, yoga, or listening to music. Journaling. Talking to a trusted person. Spending time with friends and family. Minimize exposure to UV radiation to reduce your risk of skin cancer. Safety Always wear your seat belt while driving or riding in a vehicle. Do not drive: If you have been drinking alcohol. Do not ride with someone who has been drinking. If you have been using any mind-altering substances or drugs. While texting. When you are tired or distracted. Wear a helmet and other protective equipment during sports activities. If you have firearms in your house, make sure you follow all gun safety procedures. Seek help if you have been physically or sexually abused. What's next? Go to your health care provider once a year for an annual wellness visit. Ask your health care provider  how often you should have your eyes and teeth checked. Stay up to date on all vaccines. This information is not intended to replace advice given to you by your health care provider. Make sure you discuss any questions you have with your health care provider. Document Revised: 07/17/2020 Document Reviewed: 07/17/2020 Elsevier Patient Education  2024 [employer].

## 2023-02-26 NOTE — Progress Notes (Signed)
Subjective:    Patient ID: Lori Benton, female    DOB: 1990/11/27, 33 y.o.   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 Pt thinks eats moderate healthy. Drinks 2-3 sodas at night. Non smoker. Rare alcohol use now. . Has 3 children.   Up to date on pap.   Pt will get pcv 20 vaccine.    Hemachromatosis and asthma hx. Pt states 2-3 times a yeas uses ventolin.   Hx hemochromatosis. Pt states dx in 2010. She saw specialist briefly. Pt sees Hematologist upstairs.  Discussed the use of AI scribe software for clinical note transcription with the patient, who gave verbal consent to proceed.  History of Present Illness   The patient, who works a mid-shift from 1 PM to 9:30 PM, is seeking a doctor's note to maintain this schedule due to health concerns related to a previous overnight shift (9 PM to 5:30 AM). The patient reported significant weight loss (30 pounds in 5 months) during the period of working the overnight shift, attributing this to a lack of appetite and prioritizing sleep over eating. The patient also reported difficulty sleeping during the day, necessitating the use of four extra strength Unisom tablets daily to induce sleep.  In addition to these concerns, the patient mentioned an incident with an asthma inhaler (Ventolin) that exploded in her backpack during the summer, indicating a need for a replacement. The patient also has a diagnosis of hemochromatosis and is under the care of a hematologist.        Review of Systems  Constitutional:  Negative for chills, fatigue and fever.       Weight loss on recent night shift. States when got off slept instead of eating.  HENT:  Negative for congestion and ear pain.   Respiratory:  Negative for cough, chest tightness, shortness of breath and wheezing.   Cardiovascular:  Negative for chest pain and palpitations.  Genitourinary:  Negative for dysuria, frequency,  menstrual problem and urgency.  Psychiatric/Behavioral:  Positive for sleep disturbance. Negative for agitation, hallucinations and suicidal ideas. The patient is not nervous/anxious.        Sleep shift disorder type described.    Past Medical History:  Diagnosis Date   Hemochromatosis      Social History   Socioeconomic History   Marital status: Married    Spouse name: Not on file   Number of children: Not on file   Years of education: Not on file   Highest education level: Associate degree: occupational, Scientist, product/process development, or vocational program  Occupational History   Not on file  Tobacco Use   Smoking status: Never   Smokeless tobacco: Never  Vaping Use   Vaping status: Never Used  Substance and Sexual Activity   Alcohol use: Yes    Alcohol/week: 7.0 standard drinks of alcohol    Types: 7 Shots of liquor per week    Comment: 7-8 shots on average every 2 weeks.   Drug use: Never   Sexual activity: Yes  Other Topics Concern   Not on file  Social History Narrative   Not on file   Social Drivers of Health   Financial Resource Strain: Medium Risk (02/26/2023)   Overall Financial Resource Strain (CARDIA)    Difficulty of Paying Living Expenses: Somewhat hard  Food Insecurity: No Food Insecurity (02/26/2023)   Hunger Vital Sign    Worried About Running Out of Food in  the Last Year: Never true    Ran Out of Food in the Last Year: Never true  Transportation Needs: No Transportation Needs (02/26/2023)   PRAPARE - Administrator, Civil Service (Medical): No    Lack of Transportation (Non-Medical): No  Physical Activity: Sufficiently Active (02/26/2023)   Exercise Vital Sign    Days of Exercise per Week: 5 days    Minutes of Exercise per Session: 80 min  Stress: No Stress Concern Present (02/26/2023)   [employer] of Occupational Health - Occupational Stress Questionnaire    Feeling of Stress : Not at all  Social Connections: Moderately Isolated (02/26/2023)    Social Connection and Isolation Panel [NHANES]    Frequency of Communication with Friends and Family: Never    Frequency of Social Gatherings with Friends and Family: More than three times a week    Attends Religious Services: Never    Database administrator or Organizations: No    Attends Engineer, structural: Not on file    Marital Status: Married  Catering manager Violence: Not on file    Past Surgical History:  Procedure Laterality Date   c section      Family History  Problem Relation Age of Onset   Hypertension Father    High Cholesterol Father    Anxiety disorder Father    Depression Father    Depression Mother    Anxiety disorder Mother    High Cholesterol Mother    Hypertension Mother     Allergies  Allergen Reactions   Celery (Apium Graveolens Var. Dulce) Skin Test Hives, Shortness Of Breath, Itching and Cough   Sulfa Antibiotics Anaphylaxis   Medrol [Methylprednisolone] Rash    Current Outpatient Medications on File Prior to Visit  Medication Sig Dispense Refill   Doxylamine Succinate, Sleep, (UNISOM PO) Take 1 tablet by mouth as needed.     No current facility-administered medications on file prior to visit.    BP (!) 140/82   Pulse 70   Resp 18   Ht 5\' 3"  (1.6 m)   Wt 115 lb (52.2 kg)   SpO2 100%   BMI 20.37 kg/m        Objective:   Physical Exam  General Mental Status- Alert. General Appearance- Not in acute distress.   Skin General: Color- Normal Color. Moisture- Normal Moisture.  Neck Carotid Arteries- Normal color. Moisture- Normal Moisture. No carotid bruits. No JVD.  Chest and Lung Exam Auscultation: Breath Sounds:-Normal.  Cardiovascular Auscultation:Rythm- Regular. Murmurs & Other Heart Sounds:Auscultation of the heart reveals- No Murmurs.  Abdomen Inspection:-Inspeection Normal. Palpation/Percussion:Note:No mass. Palpation and Percussion of the abdomen reveal- Non Tender, Non Distended + BS, no rebound or  guarding.   Neurologic Cranial Nerve exam:- CN III-XII intact(No nystagmus), symmetric smile. Drift Test:- No drift. Strength:- 5/5 equal and symmetric strength both upper and lower extremities.       Assessment & Plan:   Patient Instructions  For you wellness exam today I have ordered lipid panel future fasting. Cbc and cmp done thru hematologist.  Vaccine today pcv 20.  Recommend exercise and healthy diet.  We will let you know lab results as they come in.  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 Significant weight loss and insomnia due to overnight shift work. Excessive use of Unisom to manage sleep cycle. -Provided a doctor's note recommending a daytime shift for overall health  improvement.  Asthma -Refill Ventolin inhaler.  Hemochromatosis -Continue current management plan with Hematology.  General Health Maintenance -Order lipid panel to be done on the day of Hematology appointment. Patient advised to fast prior to the test.  Preventive Care 69-32 Years Old, Female Preventive care refers to lifestyle choices and visits with your health care provider that can promote health and wellness. Preventive care visits are also called wellness exams. What can I expect for my preventive care visit? Counseling During your preventive care visit, your health care provider may ask about your: Medical history, including: Past medical problems. Family medical history. Pregnancy history. Current health, including: Menstrual cycle. Method of birth control. Emotional well-being. Home life and relationship well-being. Sexual activity and sexual health. Lifestyle, including: Alcohol, nicotine or tobacco, and drug use. Access to firearms. Diet, exercise, and sleep habits. Work and work Astronomer. Sunscreen use. Safety issues such as seatbelt and bike helmet use. Physical  exam Your health care provider may check your: Height and weight. These may be used to calculate your BMI (body mass index). BMI is a measurement that tells if you are at a healthy weight. Waist circumference. This measures the distance around your waistline. This measurement also tells if you are at a healthy weight and may help predict your risk of certain diseases, such as type 2 diabetes and high blood pressure. Heart rate and blood pressure. Body temperature. Skin for abnormal spots. What immunizations do I need?  Vaccines are usually given at various ages, according to a schedule. Your health care provider will recommend vaccines for you based on your age, medical history, and lifestyle or other factors, such as travel or where you work. What tests do I need? Screening Your health care provider may recommend screening tests for certain conditions. This may include: Pelvic exam and Pap test. Lipid and cholesterol levels. Diabetes screening. This is done by checking your blood sugar (glucose) after you have not eaten for a while (fasting). Hepatitis B test. Hepatitis C test. HIV (human immunodeficiency virus) test. STI (sexually transmitted infection) testing, if you are at risk. BRCA-related cancer screening. This may be done if you have a family history of breast, ovarian, tubal, or peritoneal cancers. Talk with your health care provider about your test results, treatment options, and if necessary, the need for more tests. Follow these instructions at home: Eating and drinking  Eat a healthy diet that includes fresh fruits and vegetables, whole grains, lean protein, and low-fat dairy products. Take vitamin and mineral supplements as recommended by your health care provider. Do not drink alcohol if: Your health care provider tells you not to drink. You are pregnant, may be pregnant, or are planning to become pregnant. If you drink alcohol: Limit how much you have to 0-1 drink a  day. Know how much alcohol is in your drink. In the [place] one drink equals one 12 oz bottle of beer (355 mL), one 5 oz glass of wine (148 mL), or one 1 oz glass of hard liquor (44 mL). Lifestyle Brush your teeth every morning and night with fluoride toothpaste. Floss one time each day. Exercise for at least 30 minutes 5 or more days each week. Do not use any products that contain nicotine or tobacco. These products include cigarettes, chewing tobacco, and vaping devices, such as e-cigarettes. If you need help quitting, ask your health care provider. Do not use drugs. If you are sexually active, practice safe sex. Use a condom or other form of protection to  prevent STIs. If you do not wish to become pregnant, use a form of birth control. If you plan to become pregnant, see your health care provider for a prepregnancy visit. Find healthy ways to manage stress, such as: Meditation, yoga, or listening to music. Journaling. Talking to a trusted person. Spending time with friends and family. Minimize exposure to UV radiation to reduce your risk of skin cancer. Safety Always wear your seat belt while driving or riding in a vehicle. Do not drive: If you have been drinking alcohol. Do not ride with someone who has been drinking. If you have been using any mind-altering substances or drugs. While texting. When you are tired or distracted. Wear a helmet and other protective equipment during sports activities. If you have firearms in your house, make sure you follow all gun safety procedures. Seek help if you have been physically or sexually abused. What's next? Go to your health care provider once a year for an annual wellness visit. Ask your health care provider how often you should have your eyes and teeth checked. Stay up to date on all vaccines. This information is not intended to replace advice given to you by your health care provider. Make sure you discuss any questions you have with your  health care provider. Document Revised: 07/17/2020 Document Reviewed: 07/17/2020 Elsevier Patient Education  2024 [employer].

## 2023-05-31 ENCOUNTER — Other Ambulatory Visit (INDEPENDENT_AMBULATORY_CARE_PROVIDER_SITE_OTHER): Payer: Commercial Managed Care - PPO

## 2023-05-31 ENCOUNTER — Inpatient Hospital Stay: Payer: 59 | Attending: Family

## 2023-05-31 ENCOUNTER — Encounter: Payer: Self-pay | Admitting: Medical Oncology

## 2023-05-31 ENCOUNTER — Inpatient Hospital Stay (HOSPITAL_BASED_OUTPATIENT_CLINIC_OR_DEPARTMENT_OTHER): Payer: 59 | Admitting: Medical Oncology

## 2023-05-31 DIAGNOSIS — E785 Hyperlipidemia, unspecified: Secondary | ICD-10-CM

## 2023-05-31 DIAGNOSIS — Z0000 Encounter for general adult medical examination without abnormal findings: Secondary | ICD-10-CM

## 2023-05-31 LAB — CBC
HCT: 38.5 % (ref 36.0–46.0)
Hemoglobin: 12.6 g/dL (ref 12.0–15.0)
MCH: 32.1 pg (ref 26.0–34.0)
MCHC: 32.7 g/dL (ref 30.0–36.0)
MCV: 98 fL (ref 80.0–100.0)
Platelets: 400 10*3/uL (ref 150–400)
RBC: 3.93 MIL/uL (ref 3.87–5.11)
RDW: 12.4 % (ref 11.5–15.5)
WBC: 9.6 10*3/uL (ref 4.0–10.5)
nRBC: 0 % (ref 0.0–0.2)

## 2023-05-31 LAB — IRON AND IRON BINDING CAPACITY (CC-WL,HP ONLY)
Iron: 59 ug/dL (ref 28–170)
Saturation Ratios: 17 % (ref 10.4–31.8)
TIBC: 349 ug/dL (ref 250–450)
UIBC: 290 ug/dL (ref 148–442)

## 2023-05-31 LAB — LIPID PANEL
Cholesterol: 235 mg/dL — ABNORMAL HIGH (ref 0–200)
HDL: 64.6 mg/dL (ref >39.00)
LDL Cholesterol: 148 mg/dL — ABNORMAL HIGH (ref 0–99)
NonHDL: 170.36
Total CHOL/HDL Ratio: 4
Triglycerides: 110 mg/dL (ref 0.0–149.0)
VLDL: 22 mg/dL (ref 0.0–40.0)

## 2023-05-31 LAB — CMP (CANCER CENTER ONLY)
ALT: 16 U/L (ref 0–44)
AST: 19 U/L (ref 15–41)
Albumin: 4.4 g/dL (ref 3.5–5.0)
Alkaline Phosphatase: 102 U/L (ref 38–126)
Anion gap: 14 (ref 5–15)
BUN: 11 mg/dL (ref 6–20)
CO2: 24 mmol/L (ref 22–32)
Calcium: 10 mg/dL (ref 8.9–10.3)
Chloride: 102 mmol/L (ref 98–111)
Creatinine: 1.2 mg/dL — ABNORMAL HIGH (ref 0.44–1.00)
GFR, Estimated: >60 mL/min (ref >60)
Glucose, Bld: 103 mg/dL — ABNORMAL HIGH (ref 70–99)
Potassium: 3.7 mmol/L (ref 3.5–5.1)
Sodium: 140 mmol/L (ref 135–145)
Total Bilirubin: <0.2 mg/dL (ref 0.0–1.2)
Total Protein: 7.5 g/dL (ref 6.5–8.1)

## 2023-05-31 LAB — FERRITIN: Ferritin: 20 ng/mL (ref 11–307)

## 2023-05-31 NOTE — Progress Notes (Signed)
 Hematology and Oncology Follow Up Visit  Lori Benton [id number] 1990-06-26 32 y.o. 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   She reports that she has been doing well. She has no concerns today. She is tired from working 3rd shift and blames her BP on this and recent red bull ingestion  No bruising or petechiae.  She has had an eye exam within 2 years Cycle regular and flow normal.  She had an abdominal US  7 years ago when she was pregnant with her last child. She reports a normal report.  No fever, chills, n/v, cough, rash, dizziness, SOB, chest pain, palpitations, abdominal pain or changes in bowel or bladder habits.  No swelling, tenderness, numbness or tingling in her extremities.  No falls or syncope.  Appetite and hydration are good.   ECOG Performance Status: 1 - Symptomatic but completely ambulatory  Medications:  Allergies as of 05/31/2023       Reactions   Celery (apium Graveolens Var. Dulce) Skin Test Hives, Shortness Of Breath, Itching, Cough   Sulfa Antibiotics Anaphylaxis   Medrol  [methylprednisolone ] Rash        Medication List        Accurate as of May 31, 2023  9:29 AM. If you have any questions, ask your nurse or doctor.          albuterol  108 (90 Base) MCG/ACT inhaler Commonly known as: VENTOLIN  HFA Inhale 2 puffs into the lungs every 6 (six) hours as needed.   UNISOM PO Take 1 tablet by mouth as needed.        Allergies:  Allergies  Allergen Reactions   Celery (Apium Graveolens Var. Dulce) Skin Test Hives, Shortness Of Breath, Itching and Cough   Sulfa Antibiotics Anaphylaxis   Medrol  [Methylprednisolone ] Rash    Past Medical History, Surgical  history, Social history, and Family History were reviewed and updated.  Review of Systems: All other 10 point review of systems is negative.   Physical Exam:  height is 5\' 3"  (1.6 m) and weight is 119 lb (54 kg). Her oral temperature is 98.2 F (36.8 C). Her blood pressure is 147/88 (abnormal) and her pulse is 78. Her respiration is 18 and oxygen saturation is 100%.   Wt Readings from Last 3 Encounters:  05/31/23 119 lb (54 kg)  02/26/23 115 lb (52.2 kg)  11/30/22 114 lb (51.7 kg)    Ocular: Sclerae unicteric, pupils equal, round and reactive to light Ear-nose-throat: Oropharynx clear, dentition fair Lymphatic: No cervical or supraclavicular adenopathy Lungs no rales or rhonchi, good excursion bilaterally Heart regular rate and rhythm, no murmur appreciated Abd soft, nontender, positive bowel sounds MSK no focal spinal tenderness, no joint edema Neuro: non-focal, well-oriented, appropriate affect   Lab Results  Component Value Date   WBC 9.6 05/31/2023   HGB 12.6 05/31/2023   HCT 38.5 05/31/2023   MCV 98.0 05/31/2023   PLT 400 05/31/2023   Lab Results  Component Value Date   FERRITIN 18 11/30/2022   IRON 41 11/30/2022   TIBC 321 11/30/2022   UIBC 280 11/30/2022   IRONPCTSAT 13 11/30/2022   Lab Results  Component Value Date   RETICCTPCT  1.9 02/04/2021   RBC 3.93 05/31/2023   No results found for: "KPAFRELGTCHN", "LAMBDASER", "KAPLAMBRATIO" No results found for: "IGGSERUM", "IGA", "IGMSERUM" No results found for: "TOTALPROTELP", "ALBUMINELP", "A1GS", "A2GS", "BETS", "BETA2SER", "GAMS", "MSPIKE", "SPEI"   Chemistry      Component Value Date/Time   NA 144 11/30/2022 0854   K 3.9 11/30/2022 0854   CL 102 11/30/2022 0854   CO2 32 11/30/2022 0854   BUN 10 11/30/2022 0854   CREATININE 0.99 11/30/2022 0854      Component Value Date/Time   CALCIUM 9.3 11/30/2022 0854   ALKPHOS 87 11/30/2022 0854   AST 15 11/30/2022 0854   ALT 13 11/30/2022 0854   BILITOT 0.3  11/30/2022 0854      Encounter Diagnosis  Name Primary?   Hereditary hemochromatosis (HCC) Yes     Impression and Plan:  Lori Benton is a very pleasant 33 yo caucasian female with history of hemochromatosis diagnosed in 2010. Phlebotomy to maintain iron saturation < 50% and ferritin < 100.   CBC is normal  Last TSH was normal on 11/30/2022 Last Eye exam is UTD Last Abdominal US  is due She has had 3 red bull drinks as she worked over night.  Iron studies are pending. She will donate with [facility] if needed.   Abdominal US  order placed RTC 6 months APP, labs (CBC, CMP, TSH, ferritin, iron)-[facility]  Sharla Davis, PA-C 4/28/20259:29 AM

## 2023-06-01 ENCOUNTER — Encounter: Payer: Self-pay | Admitting: Medical

## 2023-06-01 NOTE — Addendum Note (Signed)
 Addended by: Serafina Damme on: 06/01/2023 06:47 PM   Modules accepted: Orders

## 2023-06-02 ENCOUNTER — Encounter: Payer: Self-pay | Admitting: Medical Oncology

## 2023-06-03 ENCOUNTER — Telehealth (HOSPITAL_BASED_OUTPATIENT_CLINIC_OR_DEPARTMENT_OTHER): Payer: Self-pay

## 2023-07-10 ENCOUNTER — Ambulatory Visit (HOSPITAL_BASED_OUTPATIENT_CLINIC_OR_DEPARTMENT_OTHER)
Admission: RE | Admit: 2023-07-10 | Discharge: 2023-07-10 | Disposition: A | Discharge status: Home / Self Care | Source: Ambulatory Visit | Attending: Medical Oncology | Admitting: Medical Oncology

## 2023-07-15 ENCOUNTER — Ambulatory Visit: Payer: Self-pay | Admitting: Medical Oncology

## 2023-07-23 ENCOUNTER — Encounter: Payer: Self-pay | Admitting: Medical

## 2023-07-28 ENCOUNTER — Ambulatory Visit: Admitting: Medical

## 2023-07-30 ENCOUNTER — Ambulatory Visit: Admitting: Medical

## 2023-08-04 ENCOUNTER — Ambulatory Visit: Admitting: Medical

## 2023-08-11 ENCOUNTER — Ambulatory Visit: Admitting: Medical

## 2023-11-30 ENCOUNTER — Inpatient Hospital Stay: Admitting: Medical Oncology

## 2023-11-30 ENCOUNTER — Inpatient Hospital Stay: Attending: Medical Oncology

## 2023-11-30 DIAGNOSIS — D229 Melanocytic nevi, unspecified: Secondary | ICD-10-CM

## 2023-11-30 DIAGNOSIS — D225 Melanocytic nevi of trunk: Secondary | ICD-10-CM | POA: Insufficient documentation

## 2023-11-30 LAB — CMP (CANCER CENTER ONLY)
ALT: 16 U/L (ref 0–44)
AST: 18 U/L (ref 15–41)
Albumin: 4.3 g/dL (ref 3.5–5.0)
Alkaline Phosphatase: 84 U/L (ref 38–126)
Anion gap: 11 (ref 5–15)
BUN: 12 mg/dL (ref 6–20)
CO2: 23 mmol/L (ref 22–32)
Calcium: 8.9 mg/dL (ref 8.9–10.3)
Chloride: 107 mmol/L (ref 98–111)
Creatinine: 1.01 mg/dL — ABNORMAL HIGH (ref 0.44–1.00)
GFR, Estimated: >60 mL/min (ref >60)
Glucose, Bld: 121 mg/dL — ABNORMAL HIGH (ref 70–99)
Potassium: 3.9 mmol/L (ref 3.5–5.1)
Sodium: 140 mmol/L (ref 135–145)
Total Bilirubin: 0.2 mg/dL (ref 0.0–1.2)
Total Protein: 7.3 g/dL (ref 6.5–8.1)

## 2023-11-30 LAB — CBC
HCT: 37.2 % (ref 36.0–46.0)
Hemoglobin: 12 g/dL (ref 12.0–15.0)
MCH: 30.8 pg (ref 26.0–34.0)
MCHC: 32.3 g/dL (ref 30.0–36.0)
MCV: 95.4 fL (ref 80.0–100.0)
Platelets: 330 K/uL (ref 150–400)
RBC: 3.9 MIL/uL (ref 3.87–5.11)
RDW: 13.8 % (ref 11.5–15.5)
WBC: 6.8 K/uL (ref 4.0–10.5)
nRBC: 0 % (ref 0.0–0.2)

## 2023-11-30 LAB — IRON AND IRON BINDING CAPACITY (CC-WL,HP ONLY)
Iron: 25 ug/dL — ABNORMAL LOW (ref 28–170)
Saturation Ratios: 7 % — ABNORMAL LOW (ref 10.4–31.8)
TIBC: 365 ug/dL (ref 250–450)
UIBC: 340 ug/dL

## 2023-11-30 LAB — FERRITIN: Ferritin: 620 ng/mL — ABNORMAL HIGH (ref 11–307)

## 2023-11-30 NOTE — Addendum Note (Signed)
 Addended by: TONETTE DOMINO on: 11/30/2023 09:16 AM   Modules accepted: Orders

## 2023-11-30 NOTE — Progress Notes (Addendum)
 Hematology and Oncology Follow Up Visit  Lori Benton [id number] 1990/06/16 33 y.o. 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   She reports that she has been doing well. In terms of her HH she has no concerns. She does ask about a mold on her back that has changed in shape. This mole has been present for years but has elongated and is a bit itchy at times. She has a family history of non-melanoma skin cancer and has a significant sunburn history.  No bruising or petechiae.  She has had an eye exam within 2 years Cycle regular and flow normal.  No fever, chills, n/v, cough, rash, dizziness, SOB, chest pain, palpitations, abdominal pain or changes in bowel or bladder habits.  No swelling, tenderness, numbness or tingling in her extremities.  No falls or syncope.  Appetite and hydration are good.   ECOG Performance Status: 1 - Symptomatic but completely ambulatory  Medications:  Allergies as of 11/30/2023       Reactions   Celery (apium Graveolens Var. Dulce) Skin Test Hives, Shortness Of Breath, Itching, Cough   Sulfa Antibiotics Anaphylaxis   Medrol  [methylprednisolone ] Rash        Medication List        Accurate as of November 30, 2023  9:22 AM. If you have any questions, ask your nurse or doctor.          albuterol  108 (90 Base) MCG/ACT inhaler Commonly known as: VENTOLIN  HFA Inhale 2 puffs into the lungs every 6 (six) hours as needed.   UNISOM PO Take 1 tablet by mouth as needed.        Allergies:  Allergies  Allergen Reactions   Celery (Apium Graveolens Var. Dulce) Skin Test Hives, Shortness Of Breath, Itching and Cough   Sulfa Antibiotics Anaphylaxis   Medrol   [Methylprednisolone ] Rash    Past Medical History, Surgical history, Social history, and Family History were reviewed and updated.  Review of Systems: All other 10 point review of systems is negative.   Physical Exam:  height is 5' 3 (1.6 m) and weight is 119 lb 1.9 oz (54 kg). Her oral temperature is 98.1 F (36.7 C). Her blood pressure is 136/92 (abnormal) and her pulse is 71. Her respiration is 16 and oxygen saturation is 97%.   Wt Readings from Last 3 Encounters:  11/30/23 119 lb 1.9 oz (54 kg)  05/31/23 119 lb (54 kg)  02/26/23 115 lb (52.2 kg)    Ocular: Sclerae unicteric, pupils equal, round and reactive to light Ear-nose-throat: Oropharynx clear, dentition fair Lymphatic: No cervical or supraclavicular adenopathy Lungs no rales or rhonchi, good excursion bilaterally Heart regular rate and rhythm, no murmur appreciated Abd soft, nontender, positive bowel sounds MSK no focal spinal tenderness, no joint edema Neuro: non-focal, well-oriented, appropriate affect      Lab Results  Component Value Date   WBC 6.8 11/30/2023   HGB 12.0 11/30/2023   HCT 37.2 11/30/2023   MCV 95.4 11/30/2023   PLT 330 11/30/2023   Lab Results  Component Value Date   FERRITIN 20 05/31/2023   IRON 59 05/31/2023   TIBC 349 05/31/2023   UIBC 290  05/31/2023   IRONPCTSAT 17 05/31/2023   Lab Results  Component Value Date   RETICCTPCT 1.9 02/04/2021   RBC 3.90 11/30/2023   No results found for: KPAFRELGTCHN, LAMBDASER, KAPLAMBRATIO No results found for: IGGSERUM, IGA, IGMSERUM No results found for: STEPHANY CARLOTA BENSON MARKEL EARLA JOANNIE DOC VICK, SPEI   Chemistry      Component Value Date/Time   NA 140 05/31/2023 0844   K 3.7 05/31/2023 0844   CL 102 05/31/2023 0844   CO2 24 05/31/2023 0844   BUN 11 05/31/2023 0844   CREATININE 1.20 (H) 05/31/2023 0844      Component Value Date/Time   CALCIUM 10.0 05/31/2023 0844   ALKPHOS 102  05/31/2023 0844   AST 19 05/31/2023 0844   ALT 16 05/31/2023 0844   BILITOT <0.2 05/31/2023 0844      Encounter Diagnoses  Name Primary?   Hereditary hemochromatosis Yes   Atypical nevi     Impression and Plan:  [name] is a very pleasant 33 yo caucasian female with history of hemochromatosis diagnosed in 2010. Phlebotomy to maintain iron saturation < 50% and ferritin < 100.   CBC is normal  Last TSH was normal on 11/30/2022 with a value of 0.892 Last Eye exam is UTD Last Abdominal US  was performed on 07/10/2023 Iron studies are pending. She will donate with [facility] if needed.  Urgent referral to dermatology placed- she will alert us  if she has not heard back about scheduling within 7 days.   RTC 6 months APP, labs (CBC, CMP, TSH, ferritin, iron)-Lori Benton  Lori Benton Lori Dais, PA-C 10/28/20259:22 AM

## 2023-12-01 ENCOUNTER — Other Ambulatory Visit: Payer: Self-pay | Admitting: Medical Oncology

## 2023-12-06 ENCOUNTER — Other Ambulatory Visit: Payer: Self-pay

## 2023-12-06 DIAGNOSIS — D229 Melanocytic nevi, unspecified: Secondary | ICD-10-CM

## 2023-12-09 ENCOUNTER — Inpatient Hospital Stay: Attending: Medical Oncology

## 2023-12-09 ENCOUNTER — Other Ambulatory Visit: Payer: Self-pay

## 2023-12-09 DIAGNOSIS — Z79899 Other long term (current) drug therapy: Secondary | ICD-10-CM | POA: Insufficient documentation

## 2023-12-09 LAB — CMP (CANCER CENTER ONLY)
ALT: 12 U/L (ref 0–44)
AST: 16 U/L (ref 15–41)
Albumin: 4.4 g/dL (ref 3.5–5.0)
Alkaline Phosphatase: 76 U/L (ref 38–126)
Anion gap: 12 (ref 5–15)
BUN: 10 mg/dL (ref 6–20)
CO2: 21 mmol/L — ABNORMAL LOW (ref 22–32)
Calcium: 8.9 mg/dL (ref 8.9–10.3)
Chloride: 107 mmol/L (ref 98–111)
Creatinine: 0.93 mg/dL (ref 0.44–1.00)
GFR, Estimated: >60 mL/min (ref >60)
Glucose, Bld: 84 mg/dL (ref 70–99)
Potassium: 4 mmol/L (ref 3.5–5.1)
Sodium: 139 mmol/L (ref 135–145)
Total Bilirubin: 0.4 mg/dL (ref 0.0–1.2)
Total Protein: 7.5 g/dL (ref 6.5–8.1)

## 2023-12-09 LAB — CBC
HCT: 37.4 % (ref 36.0–46.0)
Hemoglobin: 12 g/dL (ref 12.0–15.0)
MCH: 30.3 pg (ref 26.0–34.0)
MCHC: 32.1 g/dL (ref 30.0–36.0)
MCV: 94.4 fL (ref 80.0–100.0)
Platelets: 362 K/uL (ref 150–400)
RBC: 3.96 MIL/uL (ref 3.87–5.11)
RDW: 13.7 % (ref 11.5–15.5)
WBC: 6.2 K/uL (ref 4.0–10.5)
nRBC: 0 % (ref 0.0–0.2)

## 2023-12-09 LAB — FERRITIN: Ferritin: 13 ng/mL (ref 11–307)

## 2023-12-09 LAB — IRON AND IRON BINDING CAPACITY (CC-WL,HP ONLY)
Iron: 71 ug/dL (ref 28–170)
Saturation Ratios: 20 % (ref 10.4–31.8)
TIBC: 358 ug/dL (ref 250–450)
UIBC: 287 ug/dL

## 2023-12-09 LAB — TSH: TSH: 1.12 u[IU]/mL (ref 0.350–4.500)

## 2023-12-13 ENCOUNTER — Ambulatory Visit: Payer: Self-pay | Admitting: Medical Oncology

## 2023-12-27 ENCOUNTER — Telehealth: Admitting: Physician Assistant

## 2023-12-27 DIAGNOSIS — H60391 Other infective otitis externa, right ear: Secondary | ICD-10-CM

## 2023-12-27 MED ORDER — CIPROFLOXACIN-DEXAMETHASONE 0.3-0.1 % OT SUSP: 4.0000 [drp] | Freq: Two times a day (BID) | OTIC | 0 refills | Status: AC

## 2023-12-27 NOTE — Progress Notes (Signed)
 E Visit for Otitis Externa  We are sorry that you are not feeling well. Here is how we plan to help!  I have prescribed: Ciprofloxacin  0.3% and dexamethasone  0.1% otic suspension four drops in affected ears two times a day for 7 days  In certain cases Otitis Externa ear may progress to a more serious bacterial infection of the middle or inner ear.  If you have a fever 102 and up and significantly worsening symptoms, this could indicate a more serious infection moving to the middle/inner and needs face to face evaluation in an office by a provider.  Your symptoms should improve over the next 3 days and should resolve in about 7 days.  HOME CARE:  Wash your hands frequently. Do not place the tip of the bottle on your ear or touch it with your fingers. You can take Acetominophen 650 mg every 4-6 hours as needed for pain.  If pain is severe or moderate, you can apply a heating pad (set on low) or hot water bottle (wrapped in a towel) to outer ear for 20 minutes.  This will also increase drainage. Avoid ear plugs Do not use Q-tips After showers, help the water run out by tilting your head to one side.  GET HELP RIGHT AWAY IF:  Fever is over 102.2 degrees. You develop progressive ear pain or hearing loss. Ear symptoms persist longer than 3 days after treatment.  MAKE SURE YOU:  Understand these instructions. Will watch your condition. Will get help right away if you are not doing well or get worse.  TO PREVENT SWIMMER'S EAR: Use a bathing cap or custom fitted swim molds to keep your ears dry. Towel off after swimming to dry your ears. Tilt your head or pull your earlobes to allow the water to escape your ear canal. If there is still water in your ears, consider using a hairdryer on the lowest setting.  Thank you for choosing an e-visit. Your e-visit answers were reviewed by a board certified advanced clinical practitioner to complete your personal care plan. Depending upon the  condition, your plan could have included both over the counter or prescription medications. Please review your pharmacy choice. Be sure that the pharmacy you have chosen is open so that you can pick up your prescription now.  If there is a problem you may message your provider in MyChart to have the prescription routed to another pharmacy. Your safety is important to us . If you have drug allergies check your prescription carefully.  For the next 24 hours, you can use MyChart to ask questions about today's visit, request a non-urgent call back, or ask for a work or school excuse from your e-visit provider. You will get an email in the next two days asking about your experience. I hope that your e-visit has been valuable and will speed your recovery.   I have spent 5 minutes in review of e-visit questionnaire, review and updating patient chart, medical decision making and response to patient.   Delon CHRISTELLA Dickinson, PA-C

## 2024-01-06 ENCOUNTER — Other Ambulatory Visit: Payer: Self-pay

## 2024-01-06 ENCOUNTER — Ambulatory Visit (INDEPENDENT_AMBULATORY_CARE_PROVIDER_SITE_OTHER): Admitting: Radiology

## 2024-01-06 ENCOUNTER — Inpatient Hospital Stay: Admission: RE | Admit: 2024-01-06 | Discharge: 2024-01-06 | Attending: Emergency Medicine

## 2024-01-06 VITALS — BP 130/81 | HR 94 | Temp 98.0°F | Resp 14 | Ht 63.0 in | Wt 121.0 lb

## 2024-01-06 DIAGNOSIS — S8991XA Unspecified injury of right lower leg, initial encounter: Secondary | ICD-10-CM | POA: Diagnosis not present

## 2024-01-06 NOTE — ED Triage Notes (Addendum)
 Pt presents with a chief complaint of right knee injury. Occurred two days ago. Stumbled into her cat's bowl and her right knee hit the ground. Still having pain. Currently rates overall pain a 5/10. Bruising present on assessment. Has been alternating Tylenol and Ibuprofen at home. Has also been applying ice. Medications help but the ice has not made a difference per description.   Able to bear weight on RLE however only for short periods of time.

## 2024-01-06 NOTE — Discharge Instructions (Addendum)
 Official radiology overread did not see any acute bony abnormality.  Continue to rest, ice, compress and elevate the knee.  You can use your brace to help provide support.  If you continue to have pain into next week you can follow-up with orthopedic for further advanced evaluation and potential for advanced imaging such as MRI.  For pain you can take 600 mg of ibuprofen and 500 mg of Tylenol, alternating every 4-6 hours.  Return to clinic for any new or urgent symptoms.

## 2024-01-06 NOTE — ED Provider Notes (Signed)
 [facility]    CSN: [id number] Arrival date & time: 01/06/24  9171      History   Chief Complaint Chief Complaint  Patient presents with   Fall   Knee Injury    HPI Lori Benton is a 33 y.o. female.   Patient presents to clinic over concern of right distal knee pain.  This happened 2 days ago.  She stumbled into her cats bowl and landed directly onto her right knee.  She has had some bruising and is able to bear weight by tiptoeing and only for short times.  Did try to come to clinic yesterday but decided to make an appointment due to prolonged wait times.  Bruising to anterior and posterior knee.  Denies twisting injury.  Has tried Tylenol and ibuprofen without any improvement.  No previous injuries to the knee.  The history is provided by the patient and medical records.  Fall    Past Medical History:  Diagnosis Date   Hemochromatosis     There are no active problems to display for this patient.   Past Surgical History:  Procedure Laterality Date   c section      OB History     Gravida  3   Para  3   Term  3   Preterm      AB      Living  3      SAB      IAB      Ectopic      Multiple      Live Births  3            Home Medications    Prior to Admission medications   Medication Sig Start Date End Date Taking? Authorizing Provider  albuterol  (VENTOLIN  HFA) 108 (90 Base) MCG/ACT inhaler Inhale 2 puffs into the lungs every 6 (six) hours as needed. 02/26/23   Saguier, [place], PA-C  Doxylamine Succinate, Sleep, (UNISOM PO) Take 1 tablet by mouth as needed. 04/03/18   [provider]    Family History Family History  Problem Relation Age of Onset   Hypertension Father    High Cholesterol Father    Anxiety disorder Father    Depression Father    Depression Mother    Anxiety disorder Mother    High Cholesterol Mother    Hypertension Mother     Social History Social History   Tobacco Use   Smoking status:  Never   Smokeless tobacco: Never  Vaping Use   Vaping status: Never Used  Substance Use Topics   Alcohol use: Yes    Alcohol/week: 7.0 standard drinks of alcohol    Types: 7 Shots of liquor per week    Comment: 7-8 shots on average every 2 weeks.   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, Sulfa antibiotics, and Medrol  [methylprednisolone ]   Review of Systems Review of Systems  Per HPI  Physical Exam Triage Vital Signs ED Triage Vitals  Encounter Vitals Group     BP 01/06/24 0838 130/81     Girls Systolic BP Percentile --      Girls Diastolic BP Percentile --      Boys Systolic BP Percentile --      Boys Diastolic BP Percentile --      Pulse Rate 01/06/24 0838 94     Resp 01/06/24 0838 14     Temp 01/06/24 0838 98 F (36.7  C)     Temp Source 01/06/24 0838 Oral     SpO2 01/06/24 0838 98 %     Weight 01/06/24 0844 121 lb (54.9 kg)     Height 01/06/24 0844 5' 3 (1.6 m)     Head Circumference --      Peak Flow --      Pain Score 01/06/24 0843 5     Pain Loc --      Pain Education --      Exclude from Growth Chart --    No data found.  Updated Vital Signs BP 130/81 (BP Location: Right Arm)   Pulse 94   Temp 98 F (36.7 C) (Oral)   Resp 14   Ht 5' 3 (1.6 m)   Wt 121 lb (54.9 kg)   LMP 12/07/2023 (Within Weeks)   SpO2 98%   BMI 21.43 kg/m   Visual Acuity Right Eye Distance:   Left Eye Distance:   Bilateral Distance:    Right Eye Near:   Left Eye Near:    Bilateral Near:     Physical Exam Vitals and nursing note reviewed.  Constitutional:      Appearance: Normal appearance.  HENT:     Head: Normocephalic and atraumatic.     Right Ear: External ear normal.     Left Ear: External ear normal.     Nose: Nose normal.     Mouth/Throat:     Mouth: Mucous membranes are moist.  Eyes:     Conjunctiva/sclera: Conjunctivae normal.  Cardiovascular:     Rate and Rhythm: Normal rate.  Pulmonary:     Effort:  Pulmonary effort is normal. No respiratory distress.  Musculoskeletal:        General: Tenderness and signs of injury present. No swelling. Normal range of motion.     Right knee: Ecchymosis and bony tenderness present. No swelling.     Instability Tests: Anterior drawer test negative. Posterior drawer test negative.       Legs:  Skin:    General: Skin is warm and dry.     Findings: Bruising present.  Neurological:     General: No focal deficit present.     Mental Status: She is alert and oriented to person, place, and time.  Psychiatric:        Mood and Affect: Mood normal.        Behavior: Behavior normal. Behavior is cooperative.      UC Treatments / Results  Labs (all labs ordered are listed, but only abnormal results are displayed) Labs Reviewed - No data to display  EKG   Radiology DG Knee Complete 4 Views Right Result Date: 01/06/2024 CLINICAL DATA:  Knee pain after fall. EXAM: RIGHT KNEE - COMPLETE 4+ VIEW COMPARISON:  None Available. FINDINGS: No evidence of fracture, dislocation, or joint effusion. No evidence of arthropathy or other focal bone abnormality. Soft tissues are unremarkable. IMPRESSION: Negative. Electronically Signed   By: Toribio Agreste M.D.   On: 01/06/2024 09:39    Procedures Procedures (including critical care time)  Medications Ordered in UC Medications - No data to display  Initial Impression / Assessment and Plan / UC Course  I have reviewed the triage vital signs and the nursing notes.  Pertinent labs & imaging results that were available during my care of the patient were reviewed by me and considered in my medical decision making (see chart for details).  Vitals and triage reviewed, patient is hemodynamically stable.  Right distal  knee with anterior and posterior bruising, appears to be healing.  Knee with full range of motion with some discomfort with extension and flexion.  Negative anterior and posterior drawer.  Ambulatory with  discomfort.  Imaging by my interpretation does not show acute bony abnormality, confirmed with radiology overread.  Suspect bruising and soft tissue injury.  Symptomatic management discussed.  Plan of care, follow-up care return precautions given, no questions at this time.     Final Clinical Impressions(s) / UC Diagnoses   Final diagnoses:  Injury of right knee, initial encounter     Discharge Instructions      Official radiology overread did not see any acute bony abnormality.  Continue to rest, ice, compress and elevate the knee.  You can use your brace to help provide support.  If you continue to have pain into next week you can follow-up with orthopedic for further advanced evaluation and potential for advanced imaging such as MRI.  For pain you can take 600 mg of ibuprofen and 500 mg of Tylenol, alternating every 4-6 hours.  Return to clinic for any new or urgent symptoms.     ED Prescriptions   None    PDMP not reviewed this encounter.   Dreama, Alfredia Desanctis  N, FNP 01/06/24 [phone]

## 2024-01-14 ENCOUNTER — Telehealth: Payer: Self-pay

## 2024-01-14 NOTE — Telephone Encounter (Signed)
 This RN called patient at this time. No answer. Left voicemail with callback number.   Called as there are concerns about patient's appointment scheduled for tomorrow at 9:30 AM. In comments, pt states exacerbating asthma symptoms. Inhaler not helping. Wanted to advise pt to come in now for provider assessment rather than waiting till morning time. Will re-attempt phone call shortly.

## 2024-01-15 ENCOUNTER — Other Ambulatory Visit: Payer: Self-pay

## 2024-01-15 ENCOUNTER — Inpatient Hospital Stay: Admission: RE | Admit: 2024-01-15 | Discharge: 2024-01-15 | Attending: Family Medicine | Admitting: Family Medicine

## 2024-01-15 VITALS — BP 121/82 | HR 86 | Temp 98.3°F | Resp 17 | Ht 63.0 in | Wt 122.0 lb

## 2024-01-15 DIAGNOSIS — R059 Cough, unspecified: Secondary | ICD-10-CM | POA: Diagnosis not present

## 2024-01-15 MED ORDER — PREDNISONE 50 MG PO TABS: ORAL_TABLET | ORAL | 0 refills | Status: AC

## 2024-01-15 NOTE — ED Provider Notes (Signed)
 [facility]    CSN: [id number] Arrival date & time: 01/15/24  0935      History   Chief Complaint Chief Complaint  Patient presents with   Asthma    HPI Lori Benton is a 33 y.o. female.   HPI 33 year old female presents with asthma exacerbation.  Patient reports more shortness of breath more recently with noted wheezing.  Patient reports symptoms going on for 6 days with pulse ox ranging from 93 to 97%.  Reports albuterol  is noneffective.  PMH significant for hemochromatosis.  Past Medical History:  Diagnosis Date   Hemochromatosis     There are no active problems to display for this patient.   Past Surgical History:  Procedure Laterality Date   c section      OB History     Gravida  3   Para  3   Term  3   Preterm      AB      Living  3      SAB      IAB      Ectopic      Multiple      Live Births  3            Home Medications    Prior to Admission medications  Medication Sig Start Date End Date Taking? Authorizing Provider  albuterol  (VENTOLIN  HFA) 108 (90 Base) MCG/ACT inhaler Inhale 2 puffs into the lungs every 6 (six) hours as needed. 02/26/23  Yes Saguier, [place], PA-C  predniSONE  (DELTASONE ) 50 MG tablet Take 1 tab p.o. daily for 5 days. 01/15/24  Yes Teddy Sharper, FNP  Doxylamine Succinate, Sleep, (UNISOM PO) Take 1 tablet by mouth as needed. 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, Sulfa antibiotics, and Medrol  [methylprednisolone ]   Review of Systems Review of Systems   Physical Exam Triage Vital Signs ED Triage Vitals  Encounter Vitals Group      BP 01/15/24 0950 121/82     Girls Systolic BP Percentile --      Girls Diastolic BP Percentile --      Boys Systolic BP Percentile --      Boys Diastolic BP Percentile --      Pulse Rate 01/15/24 0950 86     Resp 01/15/24 0950 17     Temp 01/15/24 0950 98.3 F (36.8 C)     Temp Source 01/15/24 0950 Oral     SpO2 01/15/24 0950 98 %     Weight 01/15/24 0950 122 lb (55.3 kg)     Height 01/15/24 0950 5' 3 (1.6 m)     Head Circumference --      Peak Flow --      Pain Score 01/15/24 0951 0     Pain Loc --      Pain Education --      Exclude from Growth Chart --    No data found.  Updated Vital Signs BP 121/82 (BP Location: Right Arm)   Pulse 86   Temp 98.3 F (36.8 C) (Oral)  Resp 17   Ht 5' 3 (1.6 m)   Wt 122 lb (55.3 kg)   LMP 01/11/2024 (Exact Date)   SpO2 98%   BMI 21.61 kg/m    Physical Exam Vitals and nursing note reviewed.  Constitutional:      Appearance: Normal appearance. She is normal weight.  HENT:     Head: Normocephalic and atraumatic.     Right Ear: Tympanic membrane, ear canal and external ear normal.     Left Ear: Tympanic membrane, ear canal and external ear normal.     Mouth/Throat:     Mouth: Mucous membranes are moist.     Pharynx: Oropharynx is clear.  Eyes:     Extraocular Movements: Extraocular movements intact.     Conjunctiva/sclera: Conjunctivae normal.     Pupils: Pupils are equal, round, and reactive to light.  Cardiovascular:     Rate and Rhythm: Normal rate and regular rhythm.     Heart sounds: Normal heart sounds.  Pulmonary:     Effort: Pulmonary effort is normal.     Breath sounds: Normal breath sounds. No wheezing, rhonchi or rales.     Comments: Infrequent nonproductive cough on exam Musculoskeletal:        General: Normal range of motion.  Skin:    General: Skin is warm and dry.  Neurological:     General: No focal deficit present.     Mental Status: She is alert and oriented to person, place, and time. Mental  status is at baseline.  Psychiatric:        Mood and Affect: Mood normal.        Behavior: Behavior normal.      UC Treatments / Results  Labs (all labs ordered are listed, but only abnormal results are displayed) Labs Reviewed - No data to display  EKG   Radiology No results found.  Procedures Procedures (including critical care time)  Medications Ordered in UC Medications - No data to display  Initial Impression / Assessment and Plan / UC Course  I have reviewed the triage vital signs and the nursing notes.  Pertinent labs & imaging results that were available during my care of the patient were reviewed by me and considered in my medical decision making (see chart for details).     MDM: 1.  Cough, unspecified type-Rx'd prednisone  50 mg tablet: Take 1 tablet daily x 5 days. Advised patient take medication as directed with food to completion.  Encouraged increase daily water intake to 64 ounces per day while taking this medication.  Advised if symptoms worsen and/or unresolved please follow-up with PCP or here for further evaluation.  Patient discharged home, hemodynamically stable.  Final Clinical Impressions(s) / UC Diagnoses   Final diagnoses:  Cough, unspecified type     Discharge Instructions      Advised patient take medication as directed with food to completion.  Encouraged increase daily water intake to 64 ounces per day while taking this medication.  Advised if symptoms worsen and/or unresolved please follow-up with PCP or here for further evaluation.     ED Prescriptions     Medication Sig Dispense Auth. Provider   predniSONE  (DELTASONE ) 50 MG tablet Take 1 tab p.o. daily for 5 days. 5 tablet Eugenia Eldredge, FNP      PDMP not reviewed this encounter.    [1]  Social History Tobacco Use   Smoking status: Never   Smokeless tobacco: Never  Vaping Use   Vaping status: Never Used  Substance Use Topics   Alcohol use: Yes    Alcohol/week: 7.0  standard drinks of alcohol    Types: 7 Shots of liquor per week    Comment: 7-8 shots on average every 2 weeks.   Drug use: Never     Teddy Sharper, FNP 01/15/24 1055

## 2024-01-15 NOTE — Discharge Instructions (Addendum)
 Advised patient take medication as directed with food to completion.  Encouraged increase daily water intake to 64 ounces per day while taking this medication.  Advised if symptoms worsen and/or unresolved please follow-up with PCP or here for further evaluation.

## 2024-01-15 NOTE — ED Triage Notes (Signed)
 Pt presents with a chief complaint of asthma. States she has felt more SOB recently and has noted wheezing. Has been ongoing for six days. Pulse ox at home has been ranging from 93-97%. Prescribed albuterol  inhaler has not been helping. Last used inhaler yesterday at work. Pt does not appear to be in any distress. 98% O2 room air in triage room.

## 2024-05-30 ENCOUNTER — Inpatient Hospital Stay: Admitting: Medical Oncology

## 2024-05-30 ENCOUNTER — Inpatient Hospital Stay

## 2024-07-13 ENCOUNTER — Ambulatory Visit: Admitting: Dermatology
